# Patient Record
Sex: Female | Born: 1935 | Race: White | Hispanic: No | Smoking: Former smoker
Health system: Southern US, Community
[De-identification: ages and names within clinical notes are randomized; demographics above are authoritative.]

## PROBLEM LIST (undated history)

## (undated) DIAGNOSIS — I714 Abdominal aortic aneurysm, without rupture: Secondary | ICD-10-CM

## (undated) DIAGNOSIS — E039 Hypothyroidism, unspecified: Secondary | ICD-10-CM

## (undated) DIAGNOSIS — I350 Nonrheumatic aortic (valve) stenosis: Secondary | ICD-10-CM

## (undated) DIAGNOSIS — I10 Essential (primary) hypertension: Secondary | ICD-10-CM

## (undated) HISTORY — PX: TONSILLECTOMY: SUR1361

---

## 2005-07-18 ENCOUNTER — Ambulatory Visit: Payer: Self-pay | Admitting: Ophthalmology

## 2005-09-14 ENCOUNTER — Emergency Department: Payer: Self-pay | Admitting: Emergency Medicine

## 2006-05-30 ENCOUNTER — Ambulatory Visit: Payer: Self-pay | Admitting: Internal Medicine

## 2007-06-01 ENCOUNTER — Ambulatory Visit: Payer: Self-pay | Admitting: Internal Medicine

## 2009-02-19 ENCOUNTER — Ambulatory Visit: Payer: Self-pay | Admitting: Internal Medicine

## 2009-07-08 ENCOUNTER — Ambulatory Visit: Payer: Self-pay | Admitting: Ophthalmology

## 2009-07-20 ENCOUNTER — Ambulatory Visit: Payer: Self-pay | Admitting: Ophthalmology

## 2010-02-24 ENCOUNTER — Ambulatory Visit: Payer: Self-pay | Admitting: Internal Medicine

## 2010-05-25 ENCOUNTER — Emergency Department (HOSPITAL_COMMUNITY): Admission: EM | Admit: 2010-05-25 | Discharge: 2010-05-25 | Payer: Self-pay | Admitting: Emergency Medicine

## 2011-03-02 ENCOUNTER — Ambulatory Visit: Payer: Self-pay | Admitting: Internal Medicine

## 2012-03-12 ENCOUNTER — Ambulatory Visit: Payer: Self-pay | Admitting: Internal Medicine

## 2012-03-14 ENCOUNTER — Ambulatory Visit: Payer: Self-pay | Admitting: Internal Medicine

## 2012-06-18 ENCOUNTER — Ambulatory Visit: Payer: Self-pay | Admitting: Internal Medicine

## 2013-03-18 ENCOUNTER — Ambulatory Visit: Payer: Self-pay | Admitting: Internal Medicine

## 2013-11-27 ENCOUNTER — Ambulatory Visit: Payer: Self-pay | Admitting: Internal Medicine

## 2014-03-19 ENCOUNTER — Ambulatory Visit: Payer: Self-pay | Admitting: Internal Medicine

## 2014-06-14 ENCOUNTER — Emergency Department: Payer: Self-pay | Admitting: Emergency Medicine

## 2015-01-22 ENCOUNTER — Emergency Department: Admit: 2015-01-22 | Disposition: A | Discharge status: Home / Self Care | Payer: Self-pay | Admitting: Internal Medicine

## 2015-01-22 ENCOUNTER — Ambulatory Visit: Admit: 2015-01-22 | Disposition: A | Discharge status: Home / Self Care | Payer: Self-pay | Admitting: Internal Medicine

## 2016-04-13 ENCOUNTER — Other Ambulatory Visit: Payer: Self-pay | Admitting: Internal Medicine

## 2016-04-13 DIAGNOSIS — Z1231 Encounter for screening mammogram for malignant neoplasm of breast: Secondary | ICD-10-CM

## 2016-05-02 ENCOUNTER — Ambulatory Visit
Admission: RE | Admit: 2016-05-02 | Discharge: 2016-05-02 | Disposition: A | Discharge status: Home / Self Care | Payer: Medicare HMO | Source: Ambulatory Visit | Attending: Internal Medicine | Admitting: Internal Medicine

## 2016-05-02 ENCOUNTER — Other Ambulatory Visit: Payer: Self-pay | Admitting: Internal Medicine

## 2016-05-02 DIAGNOSIS — Z1231 Encounter for screening mammogram for malignant neoplasm of breast: Secondary | ICD-10-CM | POA: Insufficient documentation

## 2016-08-02 ENCOUNTER — Emergency Department: Payer: Medicare HMO

## 2016-08-02 ENCOUNTER — Encounter: Payer: Self-pay | Admitting: Emergency Medicine

## 2016-08-02 ENCOUNTER — Emergency Department
Admission: EM | Admit: 2016-08-02 | Discharge: 2016-08-02 | Disposition: A | Discharge status: Home / Self Care | Payer: Medicare HMO | Attending: Emergency Medicine | Admitting: Emergency Medicine

## 2016-08-02 DIAGNOSIS — N3000 Acute cystitis without hematuria: Secondary | ICD-10-CM | POA: Insufficient documentation

## 2016-08-02 DIAGNOSIS — Y999 Unspecified external cause status: Secondary | ICD-10-CM | POA: Insufficient documentation

## 2016-08-02 DIAGNOSIS — Z87891 Personal history of nicotine dependence: Secondary | ICD-10-CM | POA: Diagnosis not present

## 2016-08-02 DIAGNOSIS — M5137 Other intervertebral disc degeneration, lumbosacral region: Secondary | ICD-10-CM | POA: Insufficient documentation

## 2016-08-02 DIAGNOSIS — Y9389 Activity, other specified: Secondary | ICD-10-CM | POA: Diagnosis not present

## 2016-08-02 DIAGNOSIS — W1800XA Striking against unspecified object with subsequent fall, initial encounter: Secondary | ICD-10-CM | POA: Insufficient documentation

## 2016-08-02 DIAGNOSIS — M25552 Pain in left hip: Secondary | ICD-10-CM | POA: Diagnosis present

## 2016-08-02 DIAGNOSIS — Y929 Unspecified place or not applicable: Secondary | ICD-10-CM | POA: Diagnosis not present

## 2016-08-02 DIAGNOSIS — W19XXXA Unspecified fall, initial encounter: Secondary | ICD-10-CM

## 2016-08-02 DIAGNOSIS — Y92009 Unspecified place in unspecified non-institutional (private) residence as the place of occurrence of the external cause: Secondary | ICD-10-CM

## 2016-08-02 LAB — URINALYSIS COMPLETE WITH MICROSCOPIC (ARMC ONLY)
Bacteria, UA: NONE SEEN
Bilirubin Urine: NEGATIVE
Glucose, UA: NEGATIVE mg/dL
Hgb urine dipstick: NEGATIVE
Ketones, ur: NEGATIVE mg/dL
Nitrite: NEGATIVE
Protein, ur: NEGATIVE mg/dL
Specific Gravity, Urine: 1.016 (ref 1.005–1.030)
pH: 5 (ref 5.0–8.0)

## 2016-08-02 MED ORDER — TRAMADOL HCL 50 MG PO TABS: 50.0000 mg | ORAL_TABLET | Freq: Two times a day (BID) | ORAL | 0 refills | Status: DC

## 2016-08-02 MED ORDER — TRAMADOL HCL 50 MG PO TABS
50.0000 mg | ORAL_TABLET | Freq: Once | ORAL | Status: AC
Start: 2016-08-02 — End: 2016-08-02
  Administered 2016-08-02: 50 mg via ORAL
  Filled 2016-08-02: qty 1

## 2016-08-02 MED ORDER — CEPHALEXIN 500 MG PO CAPS: 500.0000 mg | ORAL_CAPSULE | Freq: Two times a day (BID) | ORAL | 0 refills | Status: AC

## 2016-08-02 MED ORDER — CEPHALEXIN 500 MG PO CAPS
500.0000 mg | ORAL_CAPSULE | Freq: Once | ORAL | Status: AC
  Administered 2016-08-02: 500 mg via ORAL
  Filled 2016-08-02: qty 1

## 2016-08-02 NOTE — Discharge Instructions (Signed)
Your exam and x-ray do not show a hip or pelvic fracture. You have some chronic tendinitis in the soft tissues/tendons around the left hip. You also have some significant degenerative disc disease and arthritis in the lower back. This could be the cause of your pain and disability over the last few weeks. Take the pain medicine as needed. Be careful about driving after taking this medicine. You are also being treated for a bladder infection. Take the antibiotic as directed. Drink plenty of water, and empty your bladder regularly. Follow-up with Dr. Arlana Pouchate for recheck in 1 week or so.

## 2016-08-02 NOTE — ED Provider Notes (Signed)
[facility] Emergency Department Provider Note ____________________________________________  Time seen: 1213  I have reviewed the triage vital signs and the nursing notes.  HISTORY  Chief Complaint  Fall and Hip Pain  HPI Debra Bartlett is a 82 y.o. female who lives alone, presents to the ED accompanied by her church friends. She was brought into the patient's continued complaint of disability and painto her left hip. Patient reports a remote fall about 4 weeks prior, as she was getting out of her car. She describes falling, hitting the left buttocks and leg. She also describes bumping her head but denies any loss of consciousness. The patient has not sought care for interim complaints since the initial fall. She denies any recent injury, or reinjury in the interim. She has been reportedly debilitated by the acute and ongoing pain to her left hip and buttocks. The patient reports increased pain with transitioning from sit to stand. Her friends comment she seems slightly more confused than usual today.   No past medical history on file.  There are no active problems to display for this patient.  No past surgical history on file.  Prior to Admission medications   Medication Sig Start Date End Date Taking? Authorizing Provider  cephALEXin (KEFLEX) 500 MG capsule Take 1 capsule (500 mg total) by mouth 2 (two) times daily. 08/02/16 08/09/16  Trevia Nop V Bacon Jamera Vanloan, PA-C  traMADol (ULTRAM) 50 MG tablet Take 1 tablet (50 mg total) by mouth 2 (two) times daily. 08/02/16   Shawnte Demarest V Bacon Keshaun Dubey, PA-C   Allergies Review of patient's allergies indicates no known allergies.  No family history on file.  Social History Social History  Substance Use Topics  . Smoking status: Former Games developermoker  . Smokeless tobacco: Former NeurosurgeonUser  . Alcohol use Not on file   Review of Systems  Constitutional: Negative for fever. Cardiovascular: Negative for chest pain. Respiratory: Negative for  shortness of breath. Gastrointestinal: Negative for abdominal pain, vomiting and diarrhea. Genitourinary: Negative for dysuria. Musculoskeletal: Positive for back and left hip pain. Skin: Negative for rash. Neurological: Negative for headaches, focal weakness or numbness. ____________________________________________  PHYSICAL EXAM:  VITAL SIGNS: ED Triage Vitals  Enc Vitals Group     BP 08/02/16 1156 (!) 179/58     Pulse Rate 08/02/16 1156 (!) 58     Resp 08/02/16 1156 18     Temp 08/02/16 1156 98.5 F (36.9 C)     Temp Source 08/02/16 1156 Oral     SpO2 08/02/16 1156 99 %     Weight 08/02/16 1154 145 lb (65.8 kg)     Height 08/02/16 1154 5\' 3"  (1.6 m)     Head Circumference --      Peak Flow --      Pain Score --      Pain Loc --      Pain Edu? --      Excl. in GC? --    Constitutional: Alert and oriented. Well appearing and in no distress. Head: Normocephalic and atraumatic. Eyes: Conjunctivae are normal. PERRL. Normal extraocular movements Ears: Canals clear. TMs intact bilaterally. Nose: No congestion/rhinorrhea/epistaxis. Mouth/Throat: Mucous membranes are moist. Cardiovascular: Normal rate, regular rhythm. Normal distal pulses. Respiratory: Normal respiratory effort. No wheezes/rales/rhonchi. Gastrointestinal: Soft and nontender. No distention. Musculoskeletal: Normal spinal alignment without midline tenderness, spasm, deformity, or step-off. Minimally tender to palp over the left LS junction. Normal hip ROM. No hip pointer tenderness, no groin pain. Negative supine SLR. Nontender with normal  range of motion in all extremities.  Neurologic:  Normal gait without ataxia. Normal speech and language. No gross focal neurologic deficits are appreciated. Skin:  Skin is warm, dry and intact. No rash noted. No bruise, ecchymosis, or abrasion.  ____________________________________________   LABS (pertinent positives/negatives) Labs Reviewed  URINALYSIS COMPLETEWITH  MICROSCOPIC ([facility] ONLY) - Abnormal; Notable for the following:       Result Value   Color, Urine YELLOW (*)    APPearance CLEAR (*)    Leukocytes, UA 2+ (*)    Squamous Epithelial / LPF 0-5 (*)    All other components within normal limits  URINE CULTURE  ____________________________________________   RADIOLOGY  Right Hip IMPRESSION: Negative for fracture. 8 x 15 mm calcification superior to the femoral neck in the soft tissues likely due to old soft tissue injury.  I, Jahmya Onofrio, Charlesetta IvoryJenise V Bacon, personally viewed and evaluated these images (plain radiographs) as part of my medical decision making, as well as reviewing the written report by the radiologist. ____________________________________________  PROCEDURES  Macrobid 100 mg PO Ultram 50 mg PO ____________________________________________  INITIAL IMPRESSION / ASSESSMENT AND PLAN / ED COURSE  Patient with negative hip & pelvic films. She does have significant DDD in the visible pelvic films. This may represent the patients pain source. She also has a UTI. She will be treated with Keflex and Ultram. She should follow-up with Dr. Arlana Pouchate for recheck.   Clinical Course   ____________________________________________  FINAL CLINICAL IMPRESSION(S) / ED DIAGNOSES  Final diagnoses:  Left hip pain  Fall in home, initial encounter  Acute cystitis without hematuria  DDD (degenerative disc disease), lumbosacral      Lissa HoardJenise V Bacon Lenox Bink, PA-C 08/02/16 1634    Nita Sicklearolina Veronese, MD 08/03/16 1332

## 2016-08-02 NOTE — ED Triage Notes (Signed)
Pt here for a friend from church.  She brought the patient in due to pain in left hip. Pt does report fall but, according to patient, the fall was about 1 month ago.  Patient has pain in left hip only with ambulation.  Pt has confusion but friend, who sees her about once a week, feels that the confusion is a little worse.

## 2016-08-04 LAB — URINE CULTURE: Special Requests: NORMAL

## 2016-09-28 ENCOUNTER — Other Ambulatory Visit: Payer: Self-pay | Admitting: Internal Medicine

## 2016-09-28 ENCOUNTER — Ambulatory Visit
Admission: RE | Admit: 2016-09-28 | Discharge: 2016-09-28 | Disposition: A | Discharge status: Home / Self Care | Payer: Medicare HMO | Source: Ambulatory Visit | Attending: Internal Medicine | Admitting: Internal Medicine

## 2016-09-28 DIAGNOSIS — S0003XA Contusion of scalp, initial encounter: Secondary | ICD-10-CM | POA: Diagnosis not present

## 2016-09-28 DIAGNOSIS — S0990XA Unspecified injury of head, initial encounter: Secondary | ICD-10-CM | POA: Diagnosis present

## 2016-09-28 DIAGNOSIS — X58XXXA Exposure to other specified factors, initial encounter: Secondary | ICD-10-CM | POA: Diagnosis not present

## 2016-09-28 DIAGNOSIS — G319 Degenerative disease of nervous system, unspecified: Secondary | ICD-10-CM | POA: Diagnosis not present

## 2017-08-28 ENCOUNTER — Emergency Department
Admission: EM | Admit: 2017-08-28 | Discharge: 2017-08-28 | Disposition: A | Discharge status: Home / Self Care | Payer: Medicare HMO | Attending: Emergency Medicine | Admitting: Emergency Medicine

## 2017-08-28 ENCOUNTER — Encounter: Payer: Self-pay | Admitting: *Deleted

## 2017-08-28 ENCOUNTER — Emergency Department: Payer: Medicare HMO

## 2017-08-28 ENCOUNTER — Other Ambulatory Visit: Payer: Self-pay

## 2017-08-28 DIAGNOSIS — I714 Abdominal aortic aneurysm, without rupture, unspecified: Secondary | ICD-10-CM

## 2017-08-28 DIAGNOSIS — M25552 Pain in left hip: Secondary | ICD-10-CM

## 2017-08-28 DIAGNOSIS — I10 Essential (primary) hypertension: Secondary | ICD-10-CM | POA: Diagnosis not present

## 2017-08-28 DIAGNOSIS — R296 Repeated falls: Secondary | ICD-10-CM | POA: Insufficient documentation

## 2017-08-28 DIAGNOSIS — Z87891 Personal history of nicotine dependence: Secondary | ICD-10-CM | POA: Diagnosis not present

## 2017-08-28 HISTORY — DX: Essential (primary) hypertension: I10

## 2017-08-28 LAB — BASIC METABOLIC PANEL
ANION GAP: 8 (ref 5–15)
BUN: 17 mg/dL (ref 6–20)
CO2: 28 mmol/L (ref 22–32)
Calcium: 9.4 mg/dL (ref 8.9–10.3)
Chloride: 102 mmol/L (ref 101–111)
Creatinine, Ser: 0.64 mg/dL (ref 0.44–1.00)
GLUCOSE: 75 mg/dL (ref 65–99)
POTASSIUM: 4.2 mmol/L (ref 3.5–5.1)
Sodium: 138 mmol/L (ref 135–145)

## 2017-08-28 LAB — CBC
HEMATOCRIT: 37.4 % (ref 35.0–47.0)
HEMOGLOBIN: 12.7 g/dL (ref 12.0–16.0)
MCH: 31.4 pg (ref 26.0–34.0)
MCHC: 34 g/dL (ref 32.0–36.0)
MCV: 92.3 fL (ref 80.0–100.0)
Platelets: 204 10*3/uL (ref 150–440)
RBC: 4.05 MIL/uL (ref 3.80–5.20)
RDW: 14.6 % — ABNORMAL HIGH (ref 11.5–14.5)
WBC: 8.6 10*3/uL (ref 3.6–11.0)

## 2017-08-28 MED ORDER — LISINOPRIL 10 MG PO TABS
10.0000 mg | ORAL_TABLET | Freq: Once | ORAL | Status: AC
  Administered 2017-08-28: 10 mg via ORAL
  Filled 2017-08-28: qty 1

## 2017-08-28 MED ORDER — IBUPROFEN 600 MG PO TABS
600.0000 mg | ORAL_TABLET | Freq: Once | ORAL | Status: AC
  Administered 2017-08-28: 600 mg via ORAL
  Filled 2017-08-28: qty 1

## 2017-08-28 NOTE — ED Triage Notes (Signed)
Pt presents w/ c/o frequent falls, x 4 in past 2 weeks. Pt states it started w/ her falling while carrying groceries. Pt states first fall occurred x 3 months ago. Pt states last fall was a week ago down four steps. Pt denies LOC on last occasion of falling and denies any LOC for any prior fall. Pt states he has hit her head. Pt was unable to related whether or not she takes blood thinners. Pt c/o R hip pain secondary to fall. Pt states she has been taking OTC meds (tylenol) for her pain w/ relief. Pt is hypertensive during triage. Pt states she has not sought medical care for her falls prior to this because she "had no one to encourage her" like the man with her today.

## 2017-08-28 NOTE — ED Notes (Signed)
Patient transported to X-ray 

## 2017-08-28 NOTE — Discharge Instructions (Signed)
Today you were not found to have any broken bones in your hips.  You may take Tylenol for your pain.  You may also use an ice pack or heating pad for 15 minutes every 2 hours to help with pain or discomfort.  Please make an appointment to be rechecked with your primary care doctor.  Please also let your primary care doctor know that an abdominal aortic aneurysm was found on your CT scan and will need more pictures, and likely consultation by a vascular surgeon in the near future.  Return to the emergency department if you develop severe pain, lightheadedness or fainting, fever, or any other symptoms concerning to you.

## 2017-08-28 NOTE — ED Notes (Signed)
Pt presents today post fall 1 mth ago. Pt complains of pain in the hip that worsens when moving. Pt states she fell when she stumped her toe and fell down stairs and presents with bruise to right eye. Pt has been ambulatory with out any complaints. Family at bedside awaiting EDP.

## 2017-08-28 NOTE — ED Provider Notes (Signed)
[facility] Emergency Department Provider Note  ____________________________________________  Time seen: Approximately 5:30 PM  I have reviewed the triage vital signs and the nursing notes.   HISTORY  Chief Complaint Fall; Hypertension; and Hip Pain    HPI Debra Bartlett is a 83 y.o. female who lives independently with a history of recurrent falls presenting with left hip pain.  The patient reports that occasionally she becomes unsteady and falls down.  A proximally 1 month ago she had a fall which resulted in a black eye, which has almost completely resolved.  She has never fallen on her left hip, but for about a month she has been having pain in the left hip which is worse with standing and walking.  She denies any numbness or tingling, headache, lightheadedness or syncope.  She does not experience chest pain, palpitations, or shortness of breath.  She states that she uses Tylenol for her pain, which alleviates her symptoms completely.  She would not have come to the hospital today except that her friend "made her."  Past Medical History:  Diagnosis Date  . Hypertension     There are no active problems to display for this patient.   History reviewed. No pertinent surgical history.  Current Outpatient Rx  . Order #: [id number] Class: Historical Med  . Order #: [id number] Class: Historical Med  . Order #: [id number] Class: Historical Med    Allergies Patient has no known allergies.  History reviewed. No pertinent family history.  Social History Social History   Tobacco Use  . Smoking status: Former Smoker    Types: Cigarettes  . Smokeless tobacco: Former Engineer, water Use Topics  . Alcohol use: No    Frequency: Never  . Drug use: No    Review of Systems Constitutional: No fever/chills.  No lightheadedness or syncope.  Positive recurrent falls. Eyes: No visual changes.  No blurred or double vision.  Positive right orbital ecchymosis, now almost  completely resolved. ENT: No sore throat. No congestion or rhinorrhea.  No ear pain. Cardiovascular: Denies chest pain. Denies palpitations. Respiratory: Denies shortness of breath.  No cough. Gastrointestinal: No abdominal pain.  No nausea, no vomiting.  No diarrhea.  No constipation. Genitourinary: Negative for dysuria. Musculoskeletal: Negative for back pain.  No neck pain.  Positive left hip pain.  Skin: Negative for rash. Neurological: Negative for headaches. No focal numbness, tingling or weakness.  Positive occasional unsteady gait resulting in falls.    ____________________________________________   PHYSICAL EXAM:  VITAL SIGNS: ED Triage Vitals [08/28/17 1524]  Enc Vitals Group     BP (!) 210/65     Pulse Rate 64     Resp 20     Temp 98 F (36.7 C)     Temp Source Oral     SpO2 100 %     Weight 145 lb (65.8 kg)     Height 5\' 6"  (1.676 m)     Head Circumference      Peak Flow      Pain Score      Pain Loc      Pain Edu?      Excl. in GC?     Constitutional: Alert and oriented.  Chronically ill appearing and in no acute distress. Answers questions appropriately. Eyes: Conjunctivae are normal.  EOMI. No scleral icterus. Head: Positive resolving ecchymosis on the right zygomatic arch.  No battle sign. Nose: No congestion/rhinnorhea.  Swelling over the nose or septal hematoma. Mouth/Throat: Mucous membranes are  moist.  Malocclusion. Neck: No stridor.  Supple.  No JVD.  No meningismus.  No midline C-spine tenderness to palpation, step-offs or deformities.  Cardiovascular: Normal rate, regular rhythm. No murmurs, rubs or gallops.  Respiratory: Normal respiratory effort.  No accessory muscle use or retractions. Lungs CTAB.  No wheezes, rales or ronchi. Gastrointestinal: Soft, nontender and nondistended.  No guarding or rebound.  No peritoneal signs. Musculoskeletal: Pelvis stable.  Range of motion of the bilateral ankles, knees, and hips without pain.  There is no  tenderness to palpation over the left greater trochanter.  However, the patient does have some discomfort when she goes from sitting to standing.  She has a normal gait without significant discomfort, limp or ataxia.  No LE edema. No ttp in the calves or palpable cords.  Negative Homan's sign. Neurologic:  A&Ox3.  Speech is clear.  Face and smile are symmetric.  EOMI. 5 out of 5 quadriceps and hamstring strength, hip flexors, dorsiflexion and plantar flexion.  Normal sensation to light touch BLE. Skin:  Skin is warm, dry and intact. No rash noted. Psychiatric: Mood and affect are normal. Speech and behavior are normal.  Normal judgement.  ____________________________________________   LABS (all labs ordered are listed, but only abnormal results are displayed)  Labs Reviewed  CBC - Abnormal; Notable for the following components:      Result Value   RDW 14.6 (*)    All other components within normal limits  BASIC METABOLIC PANEL   ____________________________________________  EKG  ED ECG REPORT I, Rockne MenghiniNorman, Anne-Caroline, the attending physician, personally viewed and interpreted this ECG.   Date: 08/28/2017  EKG Time: 1516  Rate: 58  Rhythm: sinus bradycardia  Axis: leftward  Intervals:none  ST&T Change: No STEMI  This EKG is compared to the only other EKG we have from 2010, which is grossly unchanged in morphology.  ____________________________________________  RADIOLOGY  Ct Pelvis Wo Contrast  Result Date: 08/28/2017 CLINICAL DATA:  Frequent fall with bilateral hip pain EXAM: CT PELVIS WITHOUT CONTRAST TECHNIQUE: Multidetector CT imaging of the pelvis was performed following the standard protocol without intravenous contrast. COMPARISON:  08/28/2017, 11/27/2013 FINDINGS: Urinary Tract:  Urinary bladder is unremarkable. Bowel:  Unremarkable visualized pelvic bowel loops. Vascular/Lymphatic: Incompletely visualized large infrarenal abdominal aortic aneurysm measuring at least  6.8 x 6.5 cm. Possible small amount of high density thrombus along the left aspect of the aneurysm. No significantly enlarged lymph nodes. Reproductive:  No mass or other significant abnormality Other:  No significant pelvic effusion.  Tiny fat in the umbilicus Musculoskeletal: No acute displaced fracture or malalignment. Pubic symphysis and rami are intact. Mild degenerative changes of the bilateral hips. Gas within both SI joints. Degenerative changes of the lumbosacral spine with prominent facet degenerative change. IMPRESSION: 1. No definite acute fracture or malalignment by CT. Degenerative changes of the hips and spine. 2. Incompletely visualized large infrarenal abdominal aortic aneurysm measuring at least 6.8 cm. Dedicated CTA would be recommended to more completely evaluate the aneurysm. Electronically Signed   By: Jasmine PangKim  Fujinaga M.D.   On: 08/28/2017 18:56   Dg Hip Unilat With Pelvis 2-3 Views Left  Result Date: 08/28/2017 CLINICAL DATA:  Right hip pain after multiple falls. EXAM: DG HIP (WITH OR WITHOUT PELVIS) 2-3V LEFT COMPARISON:  Radiographs of August 02, 2016. FINDINGS: There is no evidence of hip fracture or dislocation. No significant joint space narrowing is noted. Stable crescent-shaped bone fragment seen adjacent to superior acetabulum consistent with old injury. Possible avascular  necrosis of right femoral head. IMPRESSION: No significant abnormality seen in the left hip. 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 Electronically Signed   By: Lupita RaiderJames  Green Jr, M.D.   On: 08/28/2017 17:37    ____________________________________________   PROCEDURES  Procedure(s) performed: None  Procedures  Critical Care performed: No ____________________________________________   INITIAL IMPRESSION / ASSESSMENT AND PLAN / ED COURSE  Pertinent labs & imaging results that were available during my care of the patient were reviewed by me and considered in my medical decision making (see  chart for details).  83 y.o. female presenting for recurrent falls with left hip pain.  Overall, the patient has a reassuring examination.  She does not give any history that would be suggestive of syncope, and her EKG is grossly stable from 2010 with some sinus bradycardia; I will have her follow-up with her primary care physician about this.  The patient's laboratory studies are reassuring with no evidence of anemia or electrolyte dysfunction.  Her hip x-ray shows possible AVN of the right femoral head, but her pain is in the left.  I will get a CT of the pelvis to evaluate both her hips.  She will be treated symptomatically and reevaluated for final disposition.  ----------------------------------------- 7:47 PM on 08/28/2017 -----------------------------------------  The patient's workup in the emergency department has been reassuring.  She has been ambulatory throughout her stay with some mild discomfort.  Her CT scan does not show any bony abnormalities of the hips bilaterally.  She does have an incompletely viewed infrarenal AAA measuring 6.8 cm, which I have told the patient about.  She will follow-up with her primary care physician for dedicated imaging and likely vascular surgical consultation.  She understands the risk of this condition and the importance of follow-up.  At this time, the patient will be discharged home with suggestions for symptomatic treatment of her hip pain, and instructions for close PMD follow-up.  Return precautions were discussed.  I reviewed the patient's chart.  ____________________________________________  FINAL CLINICAL IMPRESSION(S) / ED DIAGNOSES  Final diagnoses:  Left hip pain  Recurrent falls  Abdominal aortic aneurysm (AAA) without rupture (HCC)         NEW MEDICATIONS STARTED DURING THIS VISIT:  This SmartLink is deprecated. Use AVSMEDLIST instead to display the medication list for a patient.    Rockne MenghiniNorman, Anne-Caroline, MD 08/28/17 [phone]

## 2017-08-28 NOTE — ED Notes (Signed)
First nurse note   [facility]  Having left hip pain  Has had several fall over the past several months  Last fall was 2 weeks ago now having increased pain

## 2017-09-19 ENCOUNTER — Encounter (INDEPENDENT_AMBULATORY_CARE_PROVIDER_SITE_OTHER): Payer: Self-pay | Admitting: Vascular Surgery

## 2017-09-19 ENCOUNTER — Ambulatory Visit (INDEPENDENT_AMBULATORY_CARE_PROVIDER_SITE_OTHER): Payer: Medicare HMO | Admitting: Vascular Surgery

## 2017-09-19 VITALS — BP 134/66 | HR 58 | Resp 16 | Ht 66.0 in | Wt 141.0 lb

## 2017-09-19 DIAGNOSIS — I714 Abdominal aortic aneurysm, without rupture, unspecified: Secondary | ICD-10-CM

## 2017-09-19 DIAGNOSIS — I10 Essential (primary) hypertension: Secondary | ICD-10-CM | POA: Insufficient documentation

## 2017-09-19 DIAGNOSIS — I713 Abdominal aortic aneurysm, ruptured, unspecified: Secondary | ICD-10-CM

## 2017-09-19 NOTE — Progress Notes (Signed)
Patient ID: Debra Bartlett, female   DOB: 07-07-1925, 83 y.o.   MRN: [id number]  Chief Complaint  Patient presents with  . New Patient (Initial Visit)    ref Hall Busing for AAA    HPI [name] is a 83 y.o. female.  I am asked to see the patient by Dr. Hall Busing for evaluation of AAA.  The patient reports hip pain and had a CT scan of the pelvis about 3-4 weeks ago.  This shows an incidental finding of a 6.8 cm AAA.  The upper extent of this aneurysm is not seen on the pelvis only CT scan (why that was done I do not know as in general any pelvis scan should include an abdominal portion).  This was also a noncontrast CT scan so the information provided by this is somewhat independently reviewed the CT scan.  She does have some chronic back and pelvis pain although this is not any more severe now than it has been for weeks or months.  She denies any peripheral embolization signs.  No fever or chills.  No trauma or injury.   Past Medical History:  Diagnosis Date  . Hypertension     Past Surgical History:  Procedure Laterality Date  . NO PAST SURGERIES     Family History No bleeding disorders, clotting disorders, autoimmune diseases, or aneurysms  Social History Social History   Tobacco Use  . Smoking status: Former Smoker    Types: Cigarettes  . Smokeless tobacco: Former Network engineer Use Topics  . Alcohol use: No    Frequency: Never  . Drug use: No     No Known Allergies  Current Outpatient Medications  Medication Sig Dispense Refill  . acetaminophen (TYLENOL) 500 MG tablet Take 500-1,000 mg by mouth every 6 (six) hours as needed.    Marland Kitchen levothyroxine (SYNTHROID, LEVOTHROID) 100 MCG tablet Take 1 tablet by mouth daily.    Marland Kitchen lisinopril (PRINIVIL,ZESTRIL) 5 MG tablet Take 1 tablet by mouth daily.     No current facility-administered medications for this visit.       REVIEW OF SYSTEMS (Negative unless checked)  Constitutional: [] Weight loss  [] Fever  [] Chills Cardiac: [] Chest  pain   [] Chest pressure   [] Palpitations   [] Shortness of breath when laying flat   [] Shortness of breath at rest   [] Shortness of breath with exertion. Vascular:  [] Pain in legs with walking   [] Pain in legs at rest   [] Pain in legs when laying flat   [] Claudication   [] Pain in feet when walking  [] Pain in feet at rest  [] Pain in feet when laying flat   [] History of DVT   [] Phlebitis   [] Swelling in legs   [] Varicose veins   [] Non-healing ulcers Pulmonary:   [] Uses home oxygen   [] Productive cough   [] Hemoptysis   [] Wheeze  [] COPD   [] Asthma Neurologic:  [] Dizziness  [] Blackouts   [] Seizures   [] History of stroke   [] History of TIA  [] Aphasia   [] Temporary blindness   [] Dysphagia   [] Weakness or numbness in arms   [] Weakness or numbness in legs Musculoskeletal:  [x] Arthritis   [] Joint swelling   [] Joint pain   [x] Low back pain Hematologic:  [] Easy bruising  [] Easy bleeding   [] Hypercoagulable state   [] Anemic  [] Hepatitis Gastrointestinal:  [] Blood in stool   [] Vomiting blood  [] Gastroesophageal reflux/heartburn   [] Abdominal pain Genitourinary:  [] Chronic kidney disease   [] Difficult urination  [] Frequent urination  [] Burning with  urination   [] Hematuria Skin:  [] Rashes   [] Ulcers   [] Wounds Psychological:  [] History of anxiety   []  History of major depression.    Physical Exam BP 134/66 (BP Location: Left Arm)   Pulse (!) 58   Resp 16   Ht 5' 6"  (1.676 m)   Wt 64 kg (141 lb)   BMI 22.76 kg/m  Gen:  WD/WN, NAD.  Appears younger than stated age Head: [place]/AT, No temporalis wasting.  Ear/Nose/Throat: Hearing grossly intact, nares w/o erythema or drainage, oropharynx w/o Erythema/Exudate Eyes: Conjunctiva clear, sclera non-icteric  Neck: trachea midline.  No JVD.  Pulmonary:  Good air movement, respirations not labored, no use of accessory muscles Cardiac: RRR, normal S1, S2 Vascular:  Vessel Right Left  Radial Palpable Palpable                                   Gastrointestinal:  soft, non-tender/non-distended.  Enlarged aortic impulse Musculoskeletal: M/S 5/5 throughout.  Extremities without ischemic changes.  No deformity or atrophy.  Mild lower extremity edema. Neurologic: Sensation grossly intact in extremities.  Symmetrical.  Speech is fluent. Motor exam as listed above. Psychiatric: Judgment and insight appear fair. Dermatologic: No rashes or ulcers noted.  No cellulitis or open wounds. Lymph : No Cervical, Axillary, or Inguinal lymphadenopathy.   Radiology Ct Pelvis Wo Contrast  Result Date: 08/28/2017 CLINICAL DATA:  Frequent fall with bilateral hip pain EXAM: CT PELVIS WITHOUT CONTRAST TECHNIQUE: Multidetector CT imaging of the pelvis was performed following the standard protocol without intravenous contrast. COMPARISON:  08/28/2017, 11/27/2013 FINDINGS: Urinary Tract:  Urinary bladder is unremarkable. Bowel:  Unremarkable visualized pelvic bowel loops. Vascular/Lymphatic: Incompletely visualized large infrarenal abdominal aortic aneurysm measuring at least 6.8 x 6.5 cm. Possible small amount of high density thrombus along the left aspect of the aneurysm. No significantly enlarged lymph nodes. Reproductive:  No mass or other significant abnormality Other:  No significant pelvic effusion.  Tiny fat in the umbilicus Musculoskeletal: No acute displaced fracture or malalignment. Pubic symphysis and rami are intact. Mild degenerative changes of the bilateral hips. Gas within both SI joints. Degenerative changes of the lumbosacral spine with prominent facet degenerative change. IMPRESSION: 1. No definite acute fracture or malalignment by CT. Degenerative changes of the hips and spine. 2. Incompletely visualized large infrarenal abdominal aortic aneurysm measuring at least 6.8 cm. Dedicated CTA would be recommended to more completely evaluate the aneurysm. Electronically Signed   By: Donavan Foil M.D.   On: 08/28/2017 18:56   Dg Hip Unilat With Pelvis 2-3 Views  Left  Result Date: 08/28/2017 CLINICAL DATA:  Right hip pain after multiple falls. EXAM: DG HIP (WITH OR WITHOUT PELVIS) 2-3V LEFT COMPARISON:  Radiographs of August 02, 2016. FINDINGS: There is no evidence of hip fracture or dislocation. No significant joint space narrowing is noted. Stable crescent-shaped bone fragment seen adjacent to superior acetabulum consistent with old injury. 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 IMPRESSION: No significant abnormality seen in the left hip. 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 Electronically Signed   By: Marijo Conception, M.D.   On: 08/28/2017 17:37    Labs Recent Results (from the past 2160 hour(s))  Basic metabolic panel     Status: None   Collection Time: 08/28/17  3:32 PM  Result Value Ref Range   Sodium 138 135 - 145 mmol/L   Potassium 4.2 3.5 - 5.1 mmol/L  Chloride 102 101 - 111 mmol/L   CO2 28 22 - 32 mmol/L   Glucose, Bld 75 65 - 99 mg/dL   BUN 17 6 - 20 mg/dL   Creatinine, Ser 0.64 0.44 - 1.00 mg/dL   Calcium 9.4 8.9 - 10.3 mg/dL   GFR calc non Af Amer >60 >60 mL/min   GFR calc Af Amer >60 >60 mL/min    Comment: (NOTE) The eGFR has been calculated using the CKD EPI equation. This calculation has not been validated in all clinical situations. eGFR's persistently <60 mL/min signify possible Chronic Kidney Disease.    Anion gap 8 5 - 15  CBC     Status: Abnormal   Collection Time: 08/28/17  3:32 PM  Result Value Ref Range   WBC 8.6 3.6 - 11.0 K/uL   RBC 4.05 3.80 - 5.20 MIL/uL   Hemoglobin 12.7 12.0 - 16.0 g/dL   HCT 37.4 35.0 - 47.0 %   MCV 92.3 80.0 - 100.0 fL   MCH 31.4 26.0 - 34.0 pg   MCHC 34.0 32.0 - 36.0 g/dL   RDW 14.6 (H) 11.5 - 14.5 %   Platelets 204 150 - 440 K/uL    Assessment/Plan:  Hypertension blood pressure control important in reducing the progression of atherosclerotic disease. On appropriate oral medications.   AAA (abdominal aortic aneurysm) without rupture (HCC) Recommend:  The patient has large abdominal aortic aneurysm that is incompletely visualized on a limited noncontrasted CT scan of the pelvis only.  The upper extent of the aneurysm has not been evaluated.  The patient is otherwise in reasonable health.   Therefore, the patient should undergo endovascular repair of the AAA to prevent future leathal rupture.   Patient will require CT angiography of the abdomen and pelvis in order to appropriately plan repair of the AAA.  The risks and benefits as well as the alternative therapies was discussed in detail with the patient. All questions were answered. The patient agrees to move forward the AAA repair and therefore with CT scan.  The patient will follow up with me in the office after the CT scan to review the study.      Leotis Pain 09/19/2017, 3:01 PM   This note was created with Dragon medical transcription system.  Any errors from dictation are unintentional.

## 2017-09-19 NOTE — Assessment & Plan Note (Signed)
Recommend: The patient has large abdominal aortic aneurysm that is incompletely visualized on a limited noncontrasted CT scan of the pelvis only.  The upper extent of the aneurysm has not been evaluated.  The patient is otherwise in reasonable health.   Therefore, the patient should undergo endovascular repair of the AAA to prevent future leathal rupture.   Patient will require CT angiography of the abdomen and pelvis in order to appropriately plan repair of the AAA.  The risks and benefits as well as the alternative therapies was discussed in detail with the patient. All questions were answered. The patient agrees to move forward the AAA repair and therefore with CT scan.  The patient will follow up with me in the office after the CT scan to review the study.

## 2017-09-19 NOTE — Assessment & Plan Note (Signed)
blood pressure control important in reducing the progression of atherosclerotic disease. On appropriate oral medications.  

## 2017-09-19 NOTE — Patient Instructions (Signed)
Abdominal Aortic Aneurysm Blood pumps away from the heart through tubes (blood vessels) called arteries. Aneurysms are weak or damaged places in the wall of an artery. It bulges out like a balloon. An abdominal aortic aneurysm happens in the main artery of the body (aorta). It can burst or tear, causing bleeding inside the body. This is an emergency. It needs treatment right away. What are the causes? The exact cause is unknown. Things that could cause this problem include:  Fat and other substances building up in the lining of a tube.  Swelling of the walls of a blood vessel.  Certain tissue diseases.  Belly (abdominal) trauma.  An infection in the main artery of the body.  What increases the risk? There are things that make it more likely for you to have an aneurysm. These include:  Being over the age of 81 years old.  Having high blood pressure (hypertension).  Being a female.  Being white.  Being very overweight (obese).  Having a family history of aneurysm.  Using tobacco products.  What are the signs or symptoms? Symptoms depend on the size of the aneurysm and how fast it grows. There may not be symptoms. If symptoms occur, they can include:  Pain (belly, side, lower back, or groin).  Feeling full after eating a small amount of food.  Feeling sick to your stomach (nauseous), throwing up (vomiting), or both.  Feeling a lump in your belly that feels like it is beating (pulsating).  Feeling like you will pass out (faint).  How is this treated?  Medicine to control blood pressure and pain.  Imaging tests to see if the aneurysm gets bigger.  Surgery. How is this prevented? To lessen your chance of getting this condition:  Stop smoking. Stop chewing tobacco.  Limit or avoid alcohol.  Keep your blood pressure, blood sugar, and cholesterol within normal limits.  Eat less salt.  Eat foods low in saturated fats and cholesterol. These are found in animal and  whole dairy products.  Eat more fiber. Fiber is found in whole grains, vegetables, and fruits.  Keep a healthy weight.  Stay active and exercise often.  This information is not intended to replace advice given to you by your health care provider. Make sure you discuss any questions you have with your health care provider. Document Released: 01/14/2013 Document Revised: 02/25/2016 Document Reviewed: 10/19/2012 Elsevier Interactive Patient Education  2017 Elsevier Inc.  

## 2017-10-03 DIAGNOSIS — I714 Abdominal aortic aneurysm, without rupture, unspecified: Secondary | ICD-10-CM

## 2017-10-03 HISTORY — DX: Abdominal aortic aneurysm, without rupture, unspecified: I71.40

## 2017-10-03 HISTORY — DX: Abdominal aortic aneurysm, without rupture: I71.4

## 2017-10-11 ENCOUNTER — Telehealth: Payer: Self-pay

## 2017-10-11 NOTE — Telephone Encounter (Signed)
° °  [facility] Pre-operative Risk Assessment    Request for surgical clearance:  1. What type of surgery is being performed? Endovascular AAA repair   2. When is this surgery scheduled? Not listed   3. Are there any medications that need to be held prior to surgery and how long? Not listed   4. Practice name and name of physician performing surgery? [facility], [place]    5. What is your office phone and fax number? Phone: [phone] Fax : [phone]   6. Anesthesia type (None, local, MAC, general) ? Not listed    Debra Bartlett 10/11/2017, 4:33 PM  _________________________________________________________________   (provider comments below)

## 2017-10-11 NOTE — Telephone Encounter (Signed)
This patient needs to be seen sooner than 11/14/17 due to the nature of what we're seeing her for.  Ok to schedule for Dr. Okey DupreEnd/ Dr. Mariah MillingGollan on 10/18/17 in the 7 day hold slot.  Please call her to arrange.   Thanks!

## 2017-10-13 ENCOUNTER — Ambulatory Visit (INDEPENDENT_AMBULATORY_CARE_PROVIDER_SITE_OTHER): Payer: Medicare HMO | Admitting: Vascular Surgery

## 2017-10-13 NOTE — Telephone Encounter (Signed)
I attempted to call the patient today to r/s her appointment to sooner- no answer & no voice mail.   Will try back.

## 2017-10-16 ENCOUNTER — Ambulatory Visit
Admission: RE | Admit: 2017-10-16 | Discharge: 2017-10-16 | Disposition: A | Discharge status: Home / Self Care | Payer: Medicare HMO | Source: Ambulatory Visit | Attending: Vascular Surgery | Admitting: Vascular Surgery

## 2017-10-16 DIAGNOSIS — I713 Abdominal aortic aneurysm, ruptured, unspecified: Secondary | ICD-10-CM

## 2017-10-16 DIAGNOSIS — Q272 Other congenital malformations of renal artery: Secondary | ICD-10-CM | POA: Insufficient documentation

## 2017-10-16 DIAGNOSIS — I517 Cardiomegaly: Secondary | ICD-10-CM | POA: Insufficient documentation

## 2017-10-16 DIAGNOSIS — I2510 Atherosclerotic heart disease of native coronary artery without angina pectoris: Secondary | ICD-10-CM | POA: Diagnosis not present

## 2017-10-16 DIAGNOSIS — I714 Abdominal aortic aneurysm, without rupture: Secondary | ICD-10-CM | POA: Diagnosis not present

## 2017-10-16 LAB — POCT I-STAT CREATININE: Creatinine, Ser: 0.8 mg/dL (ref 0.44–1.00)

## 2017-10-16 MED ORDER — IOPAMIDOL (ISOVUE-370) INJECTION 76%
100.0000 mL | Freq: Once | INTRAVENOUS | Status: AC | PRN
  Administered 2017-10-16: 100 mL via INTRAVENOUS

## 2017-10-16 NOTE — Telephone Encounter (Signed)
Spoke with patient and reviewed that we were able to move her appointment up to see Dr. Kirke CorinArida next Thursday 10/19/17 at 09:20 AM. She asked several times if we were connected to what she had done this morning. She was confused regarding two doctors and reviewed information with her for over 20 minutes. Reviewed both appointments with dates, times, and locations numerous times and spelled names for her. She verbalized understanding of all instructions with no further questions at this time. Provided her with number to call back if any further questions.

## 2017-10-19 ENCOUNTER — Encounter: Payer: Self-pay | Admitting: Cardiovascular Disease

## 2017-10-19 ENCOUNTER — Ambulatory Visit: Payer: Medicare HMO | Admitting: Cardiovascular Disease

## 2017-10-19 ENCOUNTER — Other Ambulatory Visit: Payer: Self-pay

## 2017-10-19 VITALS — BP 160/64 | HR 66 | Ht 68.0 in | Wt 141.8 lb

## 2017-10-19 DIAGNOSIS — I714 Abdominal aortic aneurysm, without rupture, unspecified: Secondary | ICD-10-CM

## 2017-10-19 DIAGNOSIS — R011 Cardiac murmur, unspecified: Secondary | ICD-10-CM

## 2017-10-19 DIAGNOSIS — R0989 Other specified symptoms and signs involving the circulatory and respiratory systems: Secondary | ICD-10-CM | POA: Diagnosis not present

## 2017-10-19 DIAGNOSIS — Z01810 Encounter for preprocedural cardiovascular examination: Secondary | ICD-10-CM

## 2017-10-19 NOTE — Progress Notes (Signed)
Cardiology Office Note   Date:  10/19/2017   ID:  Debra Bartlett, DOB [birth year], MRN [id number]  PCP:  Debra Shaggy, MD  Cardiologist:   Debra Bears, MD   Chief Complaint  Patient presents with  . other    Cardiac clearance. Meds reviewed verbally with pt.      History of Present Illness: Debra Bartlett is a 84 y.o. female who was referred by Dr. Wyn Quaker for preoperative cardiovascular evaluation before planned endovascular repair of AAA.  The patient has no previous cardiac history and has been relatively healthy throughout her life other than hypertension hypothyroidism.  She is not aware of her cardiac murmur.  She is not diabetic.  She is a lifelong non-smoker and has no family history of premature coronary artery disease. She was recently found to have a large abdominal aortic aneurysm at 7 cm.  CT scan did show coronary calcifications.  The patient has no prior history of ischemic heart disease.  She denies any chest pain or shortness of breath.  She is overall active and walks regularly at the mall for exercise.  She dances frequently with no significant exertional symptoms.    Past Medical History:  Diagnosis Date  . Hypertension     Past Surgical History:  Procedure Laterality Date  . NO PAST SURGERIES       Current Outpatient Medications  Medication Sig Dispense Refill  . acetaminophen (TYLENOL) 500 MG tablet Take 500-1,000 mg by mouth every 6 (six) hours as needed.    Marland Kitchen levothyroxine (SYNTHROID, LEVOTHROID) 100 MCG tablet Take 1 tablet by mouth daily.    Marland Kitchen lisinopril (PRINIVIL,ZESTRIL) 5 MG tablet Take 1 tablet by mouth daily.     No current facility-administered medications for this visit.     Allergies:   Patient has no known allergies.    Social History:  The patient  reports that she has quit smoking. Her smoking use included cigarettes. She has quit using smokeless tobacco. She reports that she does not drink alcohol or use drugs.   Family History:  The  patient's Family history is unknown by patient.    ROS:  Please see the history of present illness.   Otherwise, review of systems are positive for none.   All other systems are reviewed and negative.    PHYSICAL EXAM: VS:  BP (!) 160/64 (BP Location: Right Arm, Patient Position: Sitting, Cuff Size: Normal)   Pulse 66   Ht 5\' 8"  (1.727 m)   Wt 141 lb 12 oz (64.3 kg)   BMI 21.55 kg/m  , BMI Body mass index is 21.55 kg/m. GEN: Well nourished, well developed, in no acute distress  HEENT: normal  Neck: no JVD, or masses.  Bilateral carotid bruits versus radiated murmur Cardiac: RRR; no  rubs, or gallops,no edema .  3 out of 6 crescendo decrescendo systolic murmur in the aortic area which is late peaking with very diminished S2. Respiratory:  clear to auscultation bilaterally, normal work of breathing GI: soft, nontender, nondistended, + BS MS: no deformity or atrophy  Skin: warm and dry, no rash Neuro:  Strength and sensation are intact Psych: euthymic mood, full affect   EKG:  EKG is ordered today. The ekg ordered today demonstrates normal sinus rhythm with PACs.  LVH with repolarization abnormalities.   Recent Labs: 08/28/2017: BUN 17; Hemoglobin 12.7; Platelets 204; Potassium 4.2; Sodium 138 10/16/2017: Creatinine, Ser 0.80    Lipid Panel No results found for: CHOL,  TRIG, HDL, CHOLHDL, VLDL, LDLCALC, LDLDIRECT    Wt Readings from Last 3 Encounters:  10/19/17 141 lb 12 oz (64.3 kg)  09/19/17 141 lb (64 kg)  08/28/17 145 lb (65.8 kg)       PAD Screen 10/19/2017  Previous PAD dx? No  Previous surgical procedure? No  Pain with walking? Yes  Subsides with rest? Yes  Feet/toe relief with dangling? No  Painful, non-healing ulcers? No  Extremities discolored? Yes      ASSESSMENT AND PLAN:  1.  The preoperative cardiovascular evaluation for endovascular repair of large abdominal aortic aneurysm: The patient overall has no anginal symptoms and EKG does not show  ischemic changes other than LVH with repolarization abnormalities.  Her functional capacity is good in spite of her age.  Stress testing is not needed. Unfortunately, her cardiac exam is suggestive of at least moderate aortic stenosis and thus I recommend evaluation with an echocardiogram. Given the size of her abdominal aortic aneurysm, that might have to be repaired as a priority regardless of cardiac testing results.  2.  Carotid bruits versus radiated murmur: I requested carotid Doppler.  3.  Abdominal aortic aneurysm: Plans for endovascular repair by Debra Bartlett.  4.  Essential hypertension: Blood pressure is mildly elevated today.  Made no change in her medications    Disposition:   FU with me as needed.   Signed,  Debra BearsMuhammad [name], MD  10/19/2017 1:43 PM    [facility]

## 2017-10-19 NOTE — Patient Instructions (Addendum)
Medication Instructions:  Your physician recommends that you continue on your current medications as directed. Please refer to the Current Medication list given to you today.   Labwork: none  Testing/Procedures: Your physician has requested that you have an echocardiogram. Echocardiography is a painless test that uses sound waves to create images of your heart. It provides your doctor with information about the size and shape of your heart and how well your heart's chambers and valves are working. This procedure takes approximately one hour. There are no restrictions for this procedure.  Your physician has requested that you have a carotid duplex. This test is an ultrasound of the carotid arteries in your neck. It looks at blood flow through these arteries that supply the brain with blood. Allow one hour for this exam. There are no restrictions or special instructions.      Follow-Up: Your physician recommends that you schedule a follow-up appointment as needed.    Any Other Special Instructions Will Be Listed Below (If Applicable).     If you need a refill on your cardiac medications before your next appointment, please call your pharmacy.  Echocardiogram An echocardiogram, or echocardiography, uses sound waves (ultrasound) to produce an image of your heart. The echocardiogram is simple, painless, obtained within a short period of time, and offers valuable information to your health care provider. The images from an echocardiogram can provide information such as:  Evidence of coronary artery disease (CAD).  Heart size.  Heart muscle function.  Heart valve function.  Aneurysm detection.  Evidence of a past heart attack.  Fluid buildup around the heart.  Heart muscle thickening.  Assess heart valve function.  Tell a health care provider about:  Any allergies you have.  All medicines you are taking, including vitamins, herbs, eye drops, creams, and over-the-counter  medicines.  Any problems you or family members have had with anesthetic medicines.  Any blood disorders you have.  Any surgeries you have had.  Any medical conditions you have.  Whether you are pregnant or may be pregnant. What happens before the procedure? No special preparation is needed. Eat and drink normally. What happens during the procedure?  In order to produce an image of your heart, gel will be applied to your chest and a wand-like tool (transducer) will be moved over your chest. The gel will help transmit the sound waves from the transducer. The sound waves will harmlessly bounce off your heart to allow the heart images to be captured in real-time motion. These images will then be recorded.  You may need an IV to receive a medicine that improves the quality of the pictures. What happens after the procedure? You may return to your normal schedule including diet, activities, and medicines, unless your health care provider tells you otherwise. This information is not intended to replace advice given to you by your health care provider. Make sure you discuss any questions you have with your health care provider. Document Released: 09/16/2000 Document Revised: 05/07/2016 Document Reviewed: 05/27/2013 Elsevier Interactive Patient Education  2017 [employer]

## 2017-10-20 ENCOUNTER — Encounter (INDEPENDENT_AMBULATORY_CARE_PROVIDER_SITE_OTHER): Payer: Self-pay | Admitting: Vascular Surgery

## 2017-10-20 ENCOUNTER — Encounter (INDEPENDENT_AMBULATORY_CARE_PROVIDER_SITE_OTHER): Payer: Self-pay

## 2017-10-20 ENCOUNTER — Ambulatory Visit (INDEPENDENT_AMBULATORY_CARE_PROVIDER_SITE_OTHER): Payer: Medicare HMO | Admitting: Vascular Surgery

## 2017-10-20 VITALS — BP 159/66 | HR 55 | Resp 16 | Wt 142.6 lb

## 2017-10-20 DIAGNOSIS — I10 Essential (primary) hypertension: Secondary | ICD-10-CM | POA: Diagnosis not present

## 2017-10-20 DIAGNOSIS — I714 Abdominal aortic aneurysm, without rupture, unspecified: Secondary | ICD-10-CM

## 2017-10-20 NOTE — Assessment & Plan Note (Signed)
blood pressure control important in reducing the progression of atherosclerotic disease and aneurysmal degeneration. On appropriate oral medications.  

## 2017-10-20 NOTE — Assessment & Plan Note (Signed)
She has undergone a CT angiogram which I have independently reviewed.  This demonstrates a 6.8 cm abdominal aortic aneurysm.  Although the proximal neck is somewhat tapered and angulated, she does appear to have an adequate neck for endovascular repair.  There is no evidence of rupture or extravasation.  Recommend: The aneurysm is > 5 cm and therefore should undergo repair. Patient is status post CT scan of the abdominal aorta. The patient is a candidate for endovascular repair.   He will require cardiac clearance.   The patient will continue antiplatelet therapy as prescribed (since the patient is undergoing endovascular repair as opposed to open repair) as well as aggressive management of hyperlipidemia. Exercise is again strongly encouraged.   The patient is reminded that lifetime routine surveillance is a necessity with an endograft.   The risks and benefits of AAA repair are reviewed with the patient.  All questions are answered.  Alternative therapies are also discussed.  The patient agrees to proceed with endovascular aneurysm repair.  Patient will follow-up with me in the office after the surgery.

## 2017-10-20 NOTE — Progress Notes (Signed)
MRN : [id number]  Debra Bartlett is a 84 y.o. (1924-10-04) female who presents with chief complaint of  Chief Complaint  Patient presents with  . Follow-up    ct results  .  History of Present Illness: Patient returns today in follow up of her abdominal aortic aneurysm.  She has undergone a CT angiogram which I have independently reviewed.  This demonstrates a 6.8 cm abdominal aortic aneurysm.  Although the proximal neck is somewhat tapered and angulated, she does appear to have an adequate neck for endovascular repair.  There is no evidence of rupture or extravasation.  She does not have any obvious symptoms.  Past Medical History:  Diagnosis Date  . Hypertension          Past Surgical History:  Procedure Laterality Date  . NO PAST SURGERIES     Family History No bleeding disorders, clotting disorders, autoimmune diseases, or aneurysms  Social History Social History        Tobacco Use  . Smoking status: Former Smoker    Types: Cigarettes  . Smokeless tobacco: Former Network engineer Use Topics  . Alcohol use: No    Frequency: Never  . Drug use: No     No Known Allergies        Current Outpatient Medications  Medication Sig Dispense Refill  . acetaminophen (TYLENOL) 500 MG tablet Take 500-1,000 mg by mouth every 6 (six) hours as needed.    Marland Kitchen levothyroxine (SYNTHROID, LEVOTHROID) 100 MCG tablet Take 1 tablet by mouth daily.    Marland Kitchen lisinopril (PRINIVIL,ZESTRIL) 5 MG tablet Take 1 tablet by mouth daily.     No current facility-administered medications for this visit.       REVIEW OF SYSTEMS (Negative unless checked)  Constitutional: _0 Weight loss  _1 Fever  _2 Chills Cardiac: _3 Chest pain   _4 Chest pressure   _5 Palpitations   _6 Shortness of breath when laying flat   _7 Shortness of breath at rest   _8 Shortness of breath with exertion. Vascular:  _9 Pain in legs with walking   _10 Pain in legs at rest   _11 Pain in legs when laying flat    _12 Claudication   _13 Pain in feet when walking  _14 Pain in feet at rest  _15 Pain in feet when laying flat   _16 History of DVT   _17 Phlebitis   _18 Swelling in legs   _19 Varicose veins   _20 Non-healing ulcers Pulmonary:   _21 Uses home oxygen   _22 Productive cough   _23 Hemoptysis   _24 Wheeze  _25 COPD   _26 Asthma Neurologic:  _27 Dizziness  _28 Blackouts   _29 Seizures   _30 History of stroke   _31 History of TIA  _32 Aphasia   _33 Temporary blindness   _34 Dysphagia   _35 Weakness or numbness in arms   _36 Weakness or numbness in legs Musculoskeletal:  _37 Arthritis   _38 Joint swelling   _39 Joint pain   _40 Low back pain Hematologic:  _41 Easy bruising  _42 Easy bleeding   _43 Hypercoagulable state   _44 Anemic  _45 Hepatitis Gastrointestinal:  _46 Blood in stool   _47 Vomiting blood  _48 Gastroesophageal reflux/heartburn   _49 Abdominal pain Genitourinary:  _50 Chronic kidney disease   _51 Difficult urination  _52 Frequent urination  _53 Burning with urination   _54 Hematuria Skin:  _55 Rashes   _56 Ulcers   _57 Wounds Psychological:  _58 History of anxiety   _59  History of major depression.     Physical Examination  BP (!) 159/66 (BP Location: Right Arm)   Pulse (!) 55   Resp 16   Wt 64.7 kg (142 lb 9.6 oz)   BMI 21.68 kg/m  Gen:  WD/WN, NAD.  Appears younger than stated age Head: [place]/AT, No temporalis wasting. Ear/Nose/Throat: Hearing grossly intact, nares w/o erythema or drainage, trachea midline Eyes: Conjunctiva clear. Sclera non-icteric Neck: Supple.  No JVD.  Pulmonary:  Good air movement, no use of accessory muscles.  Cardiac: RRR, systolic murmur Vascular:  Vessel Right Left  Radial Palpable Palpable                                   Gastrointestinal: soft, non-tender/non-distended.  Increased aortic impulse Musculoskeletal: M/S 5/5 throughout.  No deformity or atrophy.  Trace lower extremity edema. Neurologic: Sensation grossly intact in extremities.  Symmetrical.  Speech is fluent.  Psychiatric: Judgment intact, Mood & affect appropriate  for pt's clinical situation. Dermatologic: No rashes or ulcers noted.  No cellulitis or open wounds.       Labs Recent Results (from the past 2160 hour(s))  Basic metabolic panel     Status: None   Collection Time: 08/28/17  3:32 PM  Result Value Ref Range   Sodium 138 135 - 145 mmol/L   Potassium 4.2 3.5 - 5.1 mmol/L   Chloride 102 101 - 111 mmol/L   CO2 28 22 - 32 mmol/L   Glucose, Bld 75 65 - 99 mg/dL   BUN 17 6 - 20 mg/dL   Creatinine, Ser 0.64 0.44 - 1.00 mg/dL   Calcium 9.4 8.9 - 10.3 mg/dL   GFR calc non Af Amer >60 >60 mL/min   GFR calc Af Amer >60 >60 mL/min    Comment: (NOTE) The eGFR has been calculated using the CKD EPI equation. This calculation has not been validated in all clinical situations. eGFR's persistently <60 mL/min signify possible Chronic Kidney Disease.    Anion gap 8 5 - 15  CBC     Status: Abnormal   Collection Time: 08/28/17  3:32 PM  Result Value Ref Range   WBC 8.6 3.6 - 11.0 K/uL   RBC 4.05 3.80 - 5.20 MIL/uL   Hemoglobin 12.7 12.0 - 16.0 g/dL   HCT 37.4 35.0 - 47.0 %   MCV 92.3 80.0 - 100.0 fL   MCH 31.4 26.0 - 34.0 pg   MCHC 34.0 32.0 - 36.0 g/dL   RDW 14.6 (H) 11.5 - 14.5 %   Platelets 204 150 - 440 K/uL  I-STAT creatinine     Status: None   Collection Time: 10/16/17  8:58 AM  Result Value Ref Range   Creatinine, Ser 0.80 0.44 - 1.00 mg/dL    Radiology Ct Angio Abd/pel W/ And/or W/o  Result Date: 10/16/2017 CLINICAL DATA:  84 year old female with abdominal aortic aneurysm EXAM: CT ANGIOGRAPHY ABDOMEN AND PELVIS WITH CONTRAST AND WITHOUT CONTRAST TECHNIQUE: Multidetector CT imaging of the abdomen and pelvis was performed using the standard protocol during bolus administration of intravenous contrast. Multiplanar reconstructed images and MIPs were obtained and reviewed to evaluate the vascular anatomy. CONTRAST:  173m ISOVUE-370 IOPAMIDOL (ISOVUE-370) INJECTION 76% COMPARISON:  CT scan of the abdomen and pelvis 08/28/2017 FINDINGS:  VASCULAR Aorta: Fusiform aneurysmal dilatation of the infrarenal abdominal aorta with a maximal diameter of 7.0 x 6.9 cm (confirmed on the coronal and sagittal reformatted images. There is crescentic wall adherent mural thrombus. The abdominal aorta is also tortuous. Celiac: No evidence of aneurysm, dissection or significant stenosis. Incidentally, the lateral segmental branch of the left hepatic artery is replaced to the left gastric artery. SMA: Heterogeneous atherosclerotic plaque  at the origin results in mild stenosis. No aneurysm or dissection. Renals: Solitary left renal artery. Atherosclerotic plaque at the origin without significant stenosis. Atherosclerotic plaque at the origin of the dominant right renal artery results in mild stenosis. There is an accessory renal artery supplying the lower pole and a portion of the interpolar kidney. IMA: Appears occluded at the origin. Inflow: No aneurysm or dissection. Heterogeneous atherosclerotic plaque without significant stenosis. Both internal iliac arteries are patent. Proximal Outflow: Bilateral common femoral and visualized portions of the superficial and profunda femoral arteries are patent without evidence of aneurysm, dissection, vasculitis or significant stenosis. Veins: No focal venous abnormality. Review of the MIP images confirms the above findings. NON-VASCULAR Lower chest: Calcified and slightly thickened aortic valve. Mild cardiomegaly with right atrial enlargement. Calcifications present along the course of the coronary arteries. No pericardial effusion. Unremarkable visualized distal thoracic esophagus. No suspicious pulmonary mass or nodule. Hepatobiliary: Normal hepatic contour and morphology. No discrete hepatic lesions. Normal appearance of the gallbladder. No intra or extrahepatic biliary ductal dilatation. Pancreas: Unremarkable. No pancreatic ductal dilatation or surrounding inflammatory changes. Spleen: Normal in size without focal  abnormality. Adrenals/Urinary Tract: Adrenal glands are unremarkable. Kidneys are normal, without renal calculi, focal lesion, or hydronephrosis. Bladder is unremarkable. Stomach/Bowel: Stomach is within normal limits. Appendix appears normal. No evidence of bowel wall thickening, distention, or inflammatory changes. Lymphatic: No suspicious lymphadenopathy. Reproductive: Uterus and bilateral adnexa are unremarkable. Other: No abdominal wall hernia or abnormality. No abdominopelvic ascites. Musculoskeletal: No acute or significant osseous findings. Multilevel degenerative disc disease. IMPRESSION: VASCULAR 1. Fusiform infrarenal abdominal aortic aneurysm with a maximal diameter of 7 cm. 2. Accessory right renal artery supplying the right lower pole in a portion of the interpolar kidney (approximately 25-30% right renal volume). 3. Lateral segmental branch of the left hepatic artery is replaced to the right gastric artery. 4. Coronary artery calcifications. 5. Calcified and thickened aortic valve. If there is clinical concern for underlying stenosis or regurgitation, echocardiography could further evaluate. 6. Cardiomegaly with right atrial enlargement. NON-VASCULAR 1. No acute or significant abnormality. 2. Multilevel degenerative disc disease. Signed, Criselda Peaches, MD [facility] Radiology Electronically Signed   By: Jacqulynn Cadet M.D.   On: 10/16/2017 09:32   Assessment/Plan  Hypertension blood pressure control important in reducing the progression of atherosclerotic disease and aneurysmal degeneration. On appropriate oral medications.   AAA (abdominal aortic aneurysm) without rupture ([place]) She has undergone a CT angiogram which I have independently reviewed.  This demonstrates a 6.8 cm abdominal aortic aneurysm.  Although the proximal neck is somewhat tapered and angulated, she does appear to have an adequate neck for endovascular repair.  There  is no evidence of rupture or extravasation.  Recommend: The aneurysm is > 5 cm and therefore should undergo repair. Patient is status post CT scan of the abdominal aorta. The patient is a candidate for endovascular repair.   He will require cardiac clearance.   The patient will continue antiplatelet therapy as prescribed (since the patient is undergoing endovascular repair as opposed to open repair) as well as aggressive management of hyperlipidemia. Exercise is again strongly encouraged.   The patient is reminded that lifetime routine surveillance is a necessity with an endograft.   The risks and benefits of AAA repair are reviewed with the patient.  All questions are answered.  Alternative therapies are also discussed.  The patient agrees to proceed with endovascular aneurysm repair.  Patient will follow-up with me  in the office after the surgery.    Leotis Pain, MD  10/20/2017 11:52 AM    This note was created with Dragon medical transcription system.  Any errors from dictation are purely unintentional

## 2017-10-20 NOTE — Patient Instructions (Signed)
Abdominal Aortic Aneurysm Endograft Repair, Care After This sheet gives you information about how to care for yourself after your procedure. Your health care provider may also give you more specific instructions. If you have problems or questions, contact your health care provider. What can I expect after the procedure? After the procedure, it is common to have:  Pain or soreness at the incision site.  Tiredness (fatigue).  Follow these instructions at home: Activity  Get plenty of rest.  Follow instructions from your health care provider about how much you should move around and how far you should go when you take short walks. Start walking farther when your health care provider says it is okay.  Limit your activities as told by your health care provider.  Return to your normal activities as told by your health care provider. Ask your health care provider what activities are safe for you. Incision care   Follow instructions from your health care provider about how to take care of your incisions. Make sure you: ? Wash your hands with soap and water before you change your bandage (dressing). If soap and water are not available, use hand sanitizer. ? Change your dressing as told by your health care provider. ? Leave stitches (sutures), skin glue, or adhesive strips in place. These skin closures may need to stay in place for 2 weeks or longer. If adhesive strip edges start to loosen and curl up, you may trim the loose edges. Do not remove adhesive strips completely unless your health care provider tells you to do that.  Keep the incision area clean and dry.  Do not take baths, swim, or use a hot tub until your health care provider approves. Ask your health care provider if you can take showers. You may only be allowed to take sponge baths for bathing.  Check your incision area every day for signs of infection. Check for: ? More redness, swelling, or pain. ? More fluid or  blood. ? Warmth. ? Pus or a bad smell. Lifestyle  Do not use any products that contain nicotine or tobacco, such as cigarettes and e-cigarettes. If you need help quitting, ask your health care provider.  Make any other lifestyle changes that your health care provider suggests. These may include: ? Keeping your blood pressure under control. ? Finding ways to lower stress. ? Eating healthy foods that are good for your heart, such as vegetables, fruits, and whole grains that add fiber to your diet. ? Getting regular exercise. General instructions  Take over-the-counter and prescription medicines only as told by your health care provider.  Keep all follow-up visits as told by your health care provider. This is important. Contact a health care provider if:  You have pain in your abdomen, chest, or back.  You have more redness, swelling, or pain around an incision.  You have more fluid or blood coming from an incision.  Your incision feels warm to the touch.  You have pus or a bad smell coming from an incision.  You have a fever. Get help right away if:  You have trouble breathing.  You suddenly have pain in your legs or you have trouble moving either of your legs.  You faint or you feel very light-headed. This information is not intended to replace advice given to you by your health care provider. Make sure you discuss any questions you have with your health care provider. Document Released: 06/10/2015 Document Revised: 04/08/2016 Document Reviewed: 12/14/2015 Elsevier Interactive Patient Education    2018 Elsevier Inc.  

## 2017-10-27 ENCOUNTER — Ambulatory Visit (INDEPENDENT_AMBULATORY_CARE_PROVIDER_SITE_OTHER): Payer: Medicare HMO

## 2017-10-27 ENCOUNTER — Other Ambulatory Visit (INDEPENDENT_AMBULATORY_CARE_PROVIDER_SITE_OTHER): Payer: Self-pay | Admitting: Vascular Surgery

## 2017-10-27 ENCOUNTER — Other Ambulatory Visit: Payer: Self-pay

## 2017-10-27 DIAGNOSIS — R011 Cardiac murmur, unspecified: Secondary | ICD-10-CM

## 2017-10-27 DIAGNOSIS — R0989 Other specified symptoms and signs involving the circulatory and respiratory systems: Secondary | ICD-10-CM | POA: Diagnosis not present

## 2017-10-30 LAB — VAS US CAROTID
LCCADSYS: -64 cm/s
LCCAPDIAS: 9 cm/s
LEFT VERTEBRAL DIAS: -10 cm/s
LICADSYS: -74 cm/s
Left CCA dist dias: -7 cm/s
Left CCA prox sys: 60 cm/s
Left ICA dist dias: -12 cm/s
Left ICA prox dias: -7 cm/s
Left ICA prox sys: -44 cm/s
RCCADSYS: -64 cm/s
RCCAPDIAS: 9 cm/s
RIGHT VERTEBRAL DIAS: -7 cm/s
Right CCA prox sys: 48 cm/s

## 2017-10-31 ENCOUNTER — Other Ambulatory Visit: Payer: Self-pay

## 2017-10-31 ENCOUNTER — Encounter
Admission: RE | Admit: 2017-10-31 | Discharge: 2017-10-31 | Disposition: A | Discharge status: Home / Self Care | Payer: Medicare HMO | Source: Ambulatory Visit | Attending: Vascular Surgery | Admitting: Vascular Surgery

## 2017-10-31 ENCOUNTER — Telehealth: Payer: Self-pay | Admitting: Cardiovascular Disease

## 2017-10-31 DIAGNOSIS — I714 Abdominal aortic aneurysm, without rupture: Secondary | ICD-10-CM | POA: Insufficient documentation

## 2017-10-31 DIAGNOSIS — I10 Essential (primary) hypertension: Secondary | ICD-10-CM | POA: Diagnosis not present

## 2017-10-31 DIAGNOSIS — Z01810 Encounter for preprocedural cardiovascular examination: Secondary | ICD-10-CM | POA: Insufficient documentation

## 2017-10-31 DIAGNOSIS — Z01812 Encounter for preprocedural laboratory examination: Secondary | ICD-10-CM | POA: Diagnosis present

## 2017-10-31 HISTORY — DX: Abdominal aortic aneurysm, without rupture: I71.4

## 2017-10-31 HISTORY — DX: Hypothyroidism, unspecified: E03.9

## 2017-10-31 LAB — CBC WITH DIFFERENTIAL/PLATELET
BASOS ABS: 0.1 10*3/uL (ref 0–0.1)
Basophils Relative: 1 %
Eosinophils Absolute: 0.2 10*3/uL (ref 0–0.7)
Eosinophils Relative: 2 %
HEMATOCRIT: 40.5 % (ref 35.0–47.0)
Hemoglobin: 13.1 g/dL (ref 12.0–16.0)
LYMPHS PCT: 27 %
Lymphs Abs: 1.8 10*3/uL (ref 1.0–3.6)
MCH: 30.1 pg (ref 26.0–34.0)
MCHC: 32.4 g/dL (ref 32.0–36.0)
MCV: 93.1 fL (ref 80.0–100.0)
Monocytes Absolute: 0.8 10*3/uL (ref 0.2–0.9)
Monocytes Relative: 11 %
NEUTROS ABS: 4.1 10*3/uL (ref 1.4–6.5)
Neutrophils Relative %: 59 %
PLATELETS: 88 10*3/uL — AB (ref 150–440)
RBC: 4.35 MIL/uL (ref 3.80–5.20)
RDW: 15.4 % — AB (ref 11.5–14.5)
WBC: 6.9 10*3/uL (ref 3.6–11.0)

## 2017-10-31 LAB — BASIC METABOLIC PANEL
Anion gap: 6 (ref 5–15)
BUN: 16 mg/dL (ref 6–20)
CHLORIDE: 102 mmol/L (ref 101–111)
CO2: 29 mmol/L (ref 22–32)
Calcium: 9.2 mg/dL (ref 8.9–10.3)
Creatinine, Ser: 0.71 mg/dL (ref 0.44–1.00)
GFR calc Af Amer: >60 mL/min (ref >60)
GFR calc non Af Amer: >60 mL/min (ref >60)
GLUCOSE: 92 mg/dL (ref 65–99)
POTASSIUM: 3.8 mmol/L (ref 3.5–5.1)
Sodium: 137 mmol/L (ref 135–145)

## 2017-10-31 LAB — PROTIME-INR
INR: 0.99
Prothrombin Time: 13 seconds (ref 11.4–15.2)

## 2017-10-31 LAB — APTT: aPTT: 30 seconds (ref 24–36)

## 2017-10-31 LAB — SURGICAL PCR SCREEN
MRSA, PCR: NEGATIVE
Staphylococcus aureus: NEGATIVE

## 2017-10-31 NOTE — Telephone Encounter (Signed)
Clearance from [facility] for 2/6 AAA repair in Dr. Jari SportsmanArida's basket awaiting echo and carotid results.

## 2017-10-31 NOTE — Patient Instructions (Signed)
[facility]  Your procedure is scheduled with Dr. Gilda CreaseSchnier                               On:  Date:    11/08/2017                    Follow instructions given to you by Dr. Gilda CreaseSchnier  Please call Dr Wyn Quakerew and Dr Marijean HeathSchnier's office with any questions or concerns: [phone].  You will need to have someone drive you home and stay with you the night of the procedure.

## 2017-11-02 NOTE — Telephone Encounter (Signed)
Notes recorded by Iran OuchArida, Muhammad A, MD on 11/02/2017 at 5:11 PM EST Inform patient that echo showed normal ejection fraction. However, as expected she has moderate to severe aortic stenosis. Thus, she is at moderate risk for endovascular repair of aortic aneurysm. I do not think she can tolerate prolonged anesthesia and should be cautious about hypotension. I am forwarding this to Dr. Wyn Quakerew and Dr. Gilda CreaseSchnier.

## 2017-11-07 MED ORDER — CEFAZOLIN SODIUM-DEXTROSE 2-4 GM/100ML-% IV SOLN
2.0000 g | INTRAVENOUS | Status: AC
  Administered 2017-11-08: 2 g via INTRAVENOUS

## 2017-11-08 ENCOUNTER — Inpatient Hospital Stay: Payer: Medicare HMO | Admitting: Anesthesiology

## 2017-11-08 ENCOUNTER — Encounter: Payer: Self-pay | Admitting: *Deleted

## 2017-11-08 ENCOUNTER — Inpatient Hospital Stay
Admission: RE | Admit: 2017-11-08 | Discharge: 2017-11-09 | DRG: 269 | Disposition: A | Discharge status: Home / Self Care | Payer: Medicare HMO | Source: Ambulatory Visit | Attending: Vascular Surgery | Admitting: Vascular Surgery

## 2017-11-08 ENCOUNTER — Other Ambulatory Visit: Payer: Self-pay

## 2017-11-08 ENCOUNTER — Encounter
Admission: RE | Disposition: A | Discharge status: Home / Self Care | Payer: Self-pay | Source: Ambulatory Visit | Attending: Vascular Surgery

## 2017-11-08 DIAGNOSIS — I714 Abdominal aortic aneurysm, without rupture, unspecified: Secondary | ICD-10-CM | POA: Diagnosis present

## 2017-11-08 DIAGNOSIS — Z87891 Personal history of nicotine dependence: Secondary | ICD-10-CM | POA: Diagnosis not present

## 2017-11-08 DIAGNOSIS — N3000 Acute cystitis without hematuria: Secondary | ICD-10-CM | POA: Diagnosis not present

## 2017-11-08 DIAGNOSIS — K219 Gastro-esophageal reflux disease without esophagitis: Secondary | ICD-10-CM | POA: Diagnosis present

## 2017-11-08 DIAGNOSIS — I10 Essential (primary) hypertension: Secondary | ICD-10-CM | POA: Diagnosis present

## 2017-11-08 DIAGNOSIS — G9341 Metabolic encephalopathy: Secondary | ICD-10-CM | POA: Diagnosis not present

## 2017-11-08 DIAGNOSIS — R4182 Altered mental status, unspecified: Secondary | ICD-10-CM | POA: Diagnosis present

## 2017-11-08 DIAGNOSIS — Z66 Do not resuscitate: Secondary | ICD-10-CM | POA: Diagnosis not present

## 2017-11-08 DIAGNOSIS — Z79899 Other long term (current) drug therapy: Secondary | ICD-10-CM

## 2017-11-08 DIAGNOSIS — E039 Hypothyroidism, unspecified: Secondary | ICD-10-CM | POA: Diagnosis present

## 2017-11-08 DIAGNOSIS — R2681 Unsteadiness on feet: Secondary | ICD-10-CM | POA: Diagnosis not present

## 2017-11-08 DIAGNOSIS — Z8679 Personal history of other diseases of the circulatory system: Secondary | ICD-10-CM | POA: Diagnosis not present

## 2017-11-08 DIAGNOSIS — N390 Urinary tract infection, site not specified: Secondary | ICD-10-CM | POA: Diagnosis present

## 2017-11-08 DIAGNOSIS — Z7982 Long term (current) use of aspirin: Secondary | ICD-10-CM | POA: Diagnosis not present

## 2017-11-08 DIAGNOSIS — F0390 Unspecified dementia without behavioral disturbance: Secondary | ICD-10-CM | POA: Diagnosis not present

## 2017-11-08 DIAGNOSIS — E860 Dehydration: Secondary | ICD-10-CM | POA: Diagnosis not present

## 2017-11-08 DIAGNOSIS — R296 Repeated falls: Secondary | ICD-10-CM | POA: Diagnosis not present

## 2017-11-08 DIAGNOSIS — I160 Hypertensive urgency: Secondary | ICD-10-CM | POA: Diagnosis not present

## 2017-11-08 HISTORY — PX: ENDOVASCULAR REPAIR/STENT GRAFT: CATH118280

## 2017-11-08 LAB — ABO/RH: ABO/RH(D): O POS

## 2017-11-08 LAB — GLUCOSE, CAPILLARY: Glucose-Capillary: 109 mg/dL — ABNORMAL HIGH (ref 65–99)

## 2017-11-08 LAB — MRSA PCR SCREENING: MRSA BY PCR: NEGATIVE

## 2017-11-08 SURGERY — ENDOVASCULAR REPAIR/STENT GRAFT
Anesthesia: General

## 2017-11-08 MED ORDER — MAGNESIUM SULFATE 2 GM/50ML IV SOLN: 2.0000 g | Freq: Every day | INTRAVENOUS | Status: DC | PRN

## 2017-11-08 MED ORDER — HYDRALAZINE HCL 20 MG/ML IJ SOLN: 5.0000 mg | INTRAMUSCULAR | Status: DC | PRN

## 2017-11-08 MED ORDER — SUGAMMADEX SODIUM 200 MG/2ML IV SOLN
INTRAVENOUS | Status: AC
  Filled 2017-11-08: qty 2

## 2017-11-08 MED ORDER — PROPOFOL 10 MG/ML IV BOLUS
INTRAVENOUS | Status: DC | PRN
  Administered 2017-11-08: 100 mg via INTRAVENOUS

## 2017-11-08 MED ORDER — GLYCOPYRROLATE 0.2 MG/ML IJ SOLN
INTRAMUSCULAR | Status: AC
  Filled 2017-11-08: qty 1

## 2017-11-08 MED ORDER — BILBERRY 40 MG PO CAPS: ORAL_CAPSULE | Freq: Every day | ORAL | Status: DC

## 2017-11-08 MED ORDER — LIDOCAINE HCL (PF) 2 % IJ SOLN
INTRAMUSCULAR | Status: AC
  Filled 2017-11-08: qty 10

## 2017-11-08 MED ORDER — POTASSIUM CHLORIDE CRYS ER 20 MEQ PO TBCR: 20.0000 meq | EXTENDED_RELEASE_TABLET | Freq: Every day | ORAL | Status: DC | PRN

## 2017-11-08 MED ORDER — SUGAMMADEX SODIUM 200 MG/2ML IV SOLN
INTRAVENOUS | Status: DC | PRN
  Administered 2017-11-08: 150 mg via INTRAVENOUS

## 2017-11-08 MED ORDER — MORPHINE SULFATE (PF) 2 MG/ML IV SOLN: 2.0000 mg | INTRAVENOUS | Status: DC | PRN

## 2017-11-08 MED ORDER — IOPAMIDOL (ISOVUE-300) INJECTION 61%
INTRAVENOUS | Status: DC | PRN
  Administered 2017-11-08: 50 mL via INTRAVENOUS

## 2017-11-08 MED ORDER — ROCURONIUM BROMIDE 100 MG/10ML IV SOLN
INTRAVENOUS | Status: DC | PRN
  Administered 2017-11-08: 20 mg via INTRAVENOUS

## 2017-11-08 MED ORDER — LABETALOL HCL 5 MG/ML IV SOLN
10.0000 mg | INTRAVENOUS | Status: DC | PRN
  Administered 2017-11-09: 10 mg via INTRAVENOUS
  Filled 2017-11-08: qty 4

## 2017-11-08 MED ORDER — RA EAR DROPS HOMEOPATHIC OT SOLN: Freq: Every day | OTIC | Status: DC | PRN

## 2017-11-08 MED ORDER — FENTANYL CITRATE (PF) 100 MCG/2ML IJ SOLN
INTRAMUSCULAR | Status: AC
  Filled 2017-11-08: qty 2

## 2017-11-08 MED ORDER — ONDANSETRON HCL 4 MG/2ML IJ SOLN
INTRAMUSCULAR | Status: DC | PRN
  Administered 2017-11-08: 4 mg via INTRAVENOUS

## 2017-11-08 MED ORDER — ACETAMINOPHEN 325 MG PO TABS: 325.0000 mg | ORAL_TABLET | Freq: Four times a day (QID) | ORAL | Status: DC | PRN

## 2017-11-08 MED ORDER — MELATONIN 5 MG PO TABS
10.0000 mg | ORAL_TABLET | Freq: Every evening | ORAL | Status: DC | PRN
  Administered 2017-11-08: 10 mg via ORAL
  Filled 2017-11-08 (×2): qty 2

## 2017-11-08 MED ORDER — CALCIUM-VITAMIN D 500-200 MG-UNIT PO TABS
ORAL_TABLET | Freq: Every day | ORAL | Status: DC
  Administered 2017-11-08: 12:00:00 1 via ORAL
  Administered 2017-11-09: 10:00:00 via ORAL
  Filled 2017-11-08 (×2): qty 1

## 2017-11-08 MED ORDER — DOCUSATE SODIUM 100 MG PO CAPS
100.0000 mg | ORAL_CAPSULE | Freq: Every day | ORAL | Status: DC
  Administered 2017-11-09: 100 mg via ORAL
  Filled 2017-11-08: qty 1

## 2017-11-08 MED ORDER — SODIUM CHLORIDE 0.9 % IV SOLN: 500.0000 mL | Freq: Once | INTRAVENOUS | Status: DC | PRN

## 2017-11-08 MED ORDER — EPHEDRINE SULFATE 50 MG/ML IJ SOLN
INTRAMUSCULAR | Status: AC
  Filled 2017-11-08: qty 1

## 2017-11-08 MED ORDER — DEXAMETHASONE SODIUM PHOSPHATE 10 MG/ML IJ SOLN
INTRAMUSCULAR | Status: DC | PRN
  Administered 2017-11-08: 5 mg via INTRAVENOUS

## 2017-11-08 MED ORDER — DOPAMINE-DEXTROSE 3.2-5 MG/ML-% IV SOLN: 3.0000 ug/kg/min | INTRAVENOUS | Status: DC

## 2017-11-08 MED ORDER — FAMOTIDINE IN NACL 20-0.9 MG/50ML-% IV SOLN
20.0000 mg | Freq: Two times a day (BID) | INTRAVENOUS | Status: DC
  Administered 2017-11-08 (×2): 20 mg via INTRAVENOUS
  Filled 2017-11-08 (×2): qty 50

## 2017-11-08 MED ORDER — OXYCODONE HCL 5 MG PO TABS: 5.0000 mg | ORAL_TABLET | Freq: Once | ORAL | Status: DC | PRN

## 2017-11-08 MED ORDER — DEXAMETHASONE SODIUM PHOSPHATE 10 MG/ML IJ SOLN
INTRAMUSCULAR | Status: AC
  Filled 2017-11-08: qty 1

## 2017-11-08 MED ORDER — LORAZEPAM 2 MG/ML IJ SOLN
0.5000 mg | Freq: Once | INTRAMUSCULAR | Status: AC
  Administered 2017-11-08: 0.5 mg via INTRAVENOUS

## 2017-11-08 MED ORDER — FENTANYL CITRATE (PF) 100 MCG/2ML IJ SOLN
INTRAMUSCULAR | Status: DC | PRN
  Administered 2017-11-08 (×4): 25 ug via INTRAVENOUS

## 2017-11-08 MED ORDER — SUCCINYLCHOLINE CHLORIDE 20 MG/ML IJ SOLN
INTRAMUSCULAR | Status: DC | PRN
  Administered 2017-11-08: 80 mg via INTRAVENOUS

## 2017-11-08 MED ORDER — CHLORHEXIDINE GLUCONATE CLOTH 2 % EX PADS: 6.0000 | MEDICATED_PAD | Freq: Once | CUTANEOUS | Status: DC

## 2017-11-08 MED ORDER — LEVOTHYROXINE SODIUM 100 MCG PO TABS
100.0000 ug | ORAL_TABLET | Freq: Every day | ORAL | Status: DC
  Administered 2017-11-09: 100 ug via ORAL
  Filled 2017-11-08: qty 1

## 2017-11-08 MED ORDER — FAMOTIDINE 20 MG PO TABS
ORAL_TABLET | ORAL | Status: AC
  Filled 2017-11-08: qty 1

## 2017-11-08 MED ORDER — FENTANYL CITRATE (PF) 100 MCG/2ML IJ SOLN: 25.0000 ug | INTRAMUSCULAR | Status: DC | PRN

## 2017-11-08 MED ORDER — HALOPERIDOL LACTATE 5 MG/ML IJ SOLN: 2.0000 mg | Freq: Four times a day (QID) | INTRAMUSCULAR | Status: DC | PRN

## 2017-11-08 MED ORDER — EPHEDRINE SULFATE 50 MG/ML IJ SOLN
INTRAMUSCULAR | Status: DC | PRN
  Administered 2017-11-08 (×2): 10 mg via INTRAVENOUS

## 2017-11-08 MED ORDER — NITROGLYCERIN IN D5W 200-5 MCG/ML-% IV SOLN: 5.0000 ug/min | INTRAVENOUS | Status: DC

## 2017-11-08 MED ORDER — OXYCODONE HCL 5 MG/5ML PO SOLN
5.0000 mg | Freq: Once | ORAL | Status: DC | PRN
  Filled 2017-11-08: qty 5

## 2017-11-08 MED ORDER — ONDANSETRON HCL 4 MG/2ML IJ SOLN: 4.0000 mg | Freq: Four times a day (QID) | INTRAMUSCULAR | Status: DC | PRN

## 2017-11-08 MED ORDER — ACETAMINOPHEN 650 MG RE SUPP: 325.0000 mg | RECTAL | Status: DC | PRN

## 2017-11-08 MED ORDER — DIPHENHYDRAMINE HCL 50 MG PO CAPS
50.0000 mg | ORAL_CAPSULE | Freq: Every day | ORAL | Status: DC
  Administered 2017-11-08: 50 mg via ORAL
  Filled 2017-11-08: qty 1

## 2017-11-08 MED ORDER — LIDOCAINE HCL (CARDIAC) 20 MG/ML IV SOLN
INTRAVENOUS | Status: DC | PRN
  Administered 2017-11-08: 50 mg via INTRAVENOUS

## 2017-11-08 MED ORDER — ONDANSETRON HCL 4 MG/2ML IJ SOLN
INTRAMUSCULAR | Status: AC
  Filled 2017-11-08: qty 2

## 2017-11-08 MED ORDER — DEXTROSE 5 % IV SOLN
1.5000 g | Freq: Two times a day (BID) | INTRAVENOUS | Status: AC
  Administered 2017-11-08 (×2): 1.5 g via INTRAVENOUS
  Filled 2017-11-08 (×2): qty 1.5

## 2017-11-08 MED ORDER — PHENOL 1.4 % MT LIQD
1.0000 | OROMUCOSAL | Status: DC | PRN
Start: 2017-11-08 — End: 2017-11-09
  Filled 2017-11-08: qty 177

## 2017-11-08 MED ORDER — PHENYLEPHRINE HCL 10 MG/ML IJ SOLN
INTRAMUSCULAR | Status: AC
  Filled 2017-11-08: qty 1

## 2017-11-08 MED ORDER — LISINOPRIL 5 MG PO TABS
5.0000 mg | ORAL_TABLET | Freq: Every day | ORAL | Status: DC
  Administered 2017-11-08 – 2017-11-09 (×2): 5 mg via ORAL
  Filled 2017-11-08 (×2): qty 1

## 2017-11-08 MED ORDER — VITAMIN C 500 MG PO TABS
500.0000 mg | ORAL_TABLET | Freq: Every day | ORAL | Status: DC
  Administered 2017-11-08 – 2017-11-09 (×2): 500 mg via ORAL
  Filled 2017-11-08 (×2): qty 1

## 2017-11-08 MED ORDER — SUCCINYLCHOLINE CHLORIDE 20 MG/ML IJ SOLN
INTRAMUSCULAR | Status: AC
  Filled 2017-11-08: qty 1

## 2017-11-08 MED ORDER — OXYCODONE-ACETAMINOPHEN 5-325 MG PO TABS: 1.0000 | ORAL_TABLET | ORAL | Status: DC | PRN

## 2017-11-08 MED ORDER — SODIUM CHLORIDE 0.9 % IV SOLN
INTRAVENOUS | Status: DC
  Administered 2017-11-08 – 2017-11-09 (×3): via INTRAVENOUS

## 2017-11-08 MED ORDER — SODIUM CHLORIDE 0.9 % IV SOLN
INTRAVENOUS | Status: DC | PRN
  Administered 2017-11-08: 25 ug/min via INTRAVENOUS

## 2017-11-08 MED ORDER — ACETAMINOPHEN 325 MG PO TABS: 325.0000 mg | ORAL_TABLET | ORAL | Status: DC | PRN

## 2017-11-08 MED ORDER — LORAZEPAM 2 MG/ML IJ SOLN
INTRAMUSCULAR | Status: AC
  Administered 2017-11-08: 0.5 mg via INTRAVENOUS
  Filled 2017-11-08: qty 1

## 2017-11-08 MED ORDER — GLYCOPYRROLATE 0.2 MG/ML IJ SOLN
INTRAMUSCULAR | Status: DC | PRN
  Administered 2017-11-08 (×2): 0.1 mg via INTRAVENOUS

## 2017-11-08 MED ORDER — ROCURONIUM BROMIDE 50 MG/5ML IV SOLN
INTRAVENOUS | Status: AC
  Filled 2017-11-08: qty 1

## 2017-11-08 MED ORDER — METOPROLOL TARTRATE 5 MG/5ML IV SOLN: 2.0000 mg | INTRAVENOUS | Status: DC | PRN

## 2017-11-08 MED ORDER — GUAIFENESIN-DM 100-10 MG/5ML PO SYRP
15.0000 mL | ORAL_SOLUTION | ORAL | Status: DC | PRN
  Filled 2017-11-08: qty 15

## 2017-11-08 MED ORDER — PROPOFOL 10 MG/ML IV BOLUS
INTRAVENOUS | Status: AC
  Filled 2017-11-08: qty 20

## 2017-11-08 MED ORDER — LACTATED RINGERS IV SOLN
INTRAVENOUS | Status: DC
  Administered 2017-11-08: 07:00:00 via INTRAVENOUS

## 2017-11-08 MED ORDER — FAMOTIDINE 20 MG PO TABS
20.0000 mg | ORAL_TABLET | Freq: Once | ORAL | Status: AC
  Administered 2017-11-08: 20 mg via ORAL

## 2017-11-08 MED ORDER — ST JOHNS WORT 150 MG PO CAPS: ORAL_CAPSULE | Freq: Every day | ORAL | Status: DC

## 2017-11-08 MED ORDER — HEPARIN SODIUM (PORCINE) 1000 UNIT/ML IJ SOLN
INTRAMUSCULAR | Status: DC | PRN
  Administered 2017-11-08: 5000 [IU] via INTRAVENOUS

## 2017-11-08 MED ORDER — HEPARIN SODIUM (PORCINE) 1000 UNIT/ML IJ SOLN
INTRAMUSCULAR | Status: AC
  Filled 2017-11-08: qty 1

## 2017-11-08 MED ORDER — ALUM & MAG HYDROXIDE-SIMETH 200-200-20 MG/5ML PO SUSP
15.0000 mL | ORAL | Status: DC | PRN
  Filled 2017-11-08: qty 30

## 2017-11-08 SURGICAL SUPPLY — 44 items
BOOT SUTURE AID YELLOW STND (SUTURE) ×3 IMPLANT
CATH ACCU-VU SIZ PIG 5F 70CM (CATHETERS) ×3 IMPLANT
CATH BALLN CODA 9X100X32 (BALLOONS) ×3 IMPLANT
CATH BEACON 5 .035 65 C2 TIP (CATHETERS) ×3 IMPLANT
CATH BEACON 5 .035 65 KMP TIP (CATHETERS) ×3 IMPLANT
DEVICE CLOSURE PERCLS PRGLD 6F (VASCULAR PRODUCTS) ×4 IMPLANT
DEVICE PRESTO INFLATION (MISCELLANEOUS) ×3 IMPLANT
DEVICE TORQUE (MISCELLANEOUS) ×6 IMPLANT
DRYSEAL FLEXSHEATH 12FR 33CM (SHEATH) ×2
DRYSEAL FLEXSHEATH 16FR 33CM (SHEATH) ×2
ELECT CAUTERY BLADE 6.4 (BLADE) ×3 IMPLANT
ELECT REM PT RETURN 9FT ADLT (ELECTROSURGICAL) ×3
ELECTRODE REM PT RTRN 9FT ADLT (ELECTROSURGICAL) ×1 IMPLANT
EXCLUDER TNK LEG 23MX12X16 (Endovascular Graft) ×1 IMPLANT
EXCLUDER TRUNK LEG 23MX12X16 (Endovascular Graft) ×3 IMPLANT
GLIDEWIRE STIFF .35X180X3 HYDR (WIRE) ×3 IMPLANT
GLOVE BIO SURGEON STRL SZ7 (GLOVE) ×3 IMPLANT
GLOVE BIO SURGEON STRL SZ8 (GLOVE) ×3 IMPLANT
GOWN STRL REUS W/ TWL LRG LVL3 (GOWN DISPOSABLE) ×2 IMPLANT
GOWN STRL REUS W/ TWL XL LVL3 (GOWN DISPOSABLE) ×2 IMPLANT
GOWN STRL REUS W/TWL LRG LVL3 (GOWN DISPOSABLE) ×4
GOWN STRL REUS W/TWL XL LVL3 (GOWN DISPOSABLE) ×4
GRAFT EXCLUDER AORTIC 23X3.3CM (Endovascular Graft) ×3 IMPLANT
GRAFT EXCLUDER LEG 12X14 (Endovascular Graft) ×3 IMPLANT
NEEDLE ENTRY 21GA 7CM ECHOTIP (NEEDLE) ×3 IMPLANT
PACK ANGIOGRAPHY (CUSTOM PROCEDURE TRAY) ×3 IMPLANT
PACK BASIN MAJOR ARMC (MISCELLANEOUS) ×3 IMPLANT
PERCLOSE PROGLIDE 6F (VASCULAR PRODUCTS) ×12
SET INTRO CAPELLA COAXIAL (SET/KITS/TRAYS/PACK) ×3 IMPLANT
SHEATH BRITE TIP 6FRX11 (SHEATH) ×6 IMPLANT
SHEATH BRITE TIP 8FRX11 (SHEATH) ×9 IMPLANT
SHEATH DRYSEAL FLEX 12FR 33CM (SHEATH) ×1 IMPLANT
SHEATH DRYSEAL FLEX 16FR 33CM (SHEATH) ×1 IMPLANT
SHIELD RADPAD SCOOP 12X17 (MISCELLANEOUS) ×3 IMPLANT
SUT MNCRL 4-0 (SUTURE) ×2
SUT MNCRL 4-0 27XMFL (SUTURE) ×1
SUT PROLENE 6 0 BV (SUTURE) ×6 IMPLANT
SUTURE MNCRL 4-0 27XMF (SUTURE) ×1 IMPLANT
SYR 20CC LL (SYRINGE) ×3 IMPLANT
SYR MEDRAD MARK V 150ML (SYRINGE) ×3 IMPLANT
TOWEL OR 17X26 4PK STRL BLUE (TOWEL DISPOSABLE) ×3 IMPLANT
TUBING CONTRAST HIGH PRESS 72 (TUBING) ×3 IMPLANT
WIRE AMPLATZ SSTIFF .035X260CM (WIRE) ×6 IMPLANT
WIRE J 3MM .035X145CM (WIRE) ×6 IMPLANT

## 2017-11-08 NOTE — Anesthesia Post-op Follow-up Note (Signed)
Anesthesia QCDR form completed.        

## 2017-11-08 NOTE — Anesthesia Preprocedure Evaluation (Addendum)
Anesthesia Evaluation  Patient identified by MRN, date of birth, ID band Patient awake    Reviewed: Allergy & Precautions, H&P , NPO status , Patient's Chart, lab work & pertinent test results  Airway Mallampati: III  TM Distance: <3 FB Neck ROM: limited    Dental  (+) Chipped, Poor Dentition   Pulmonary neg shortness of breath, former smoker,           Cardiovascular Exercise Tolerance: Good hypertension, (-) angina+ Peripheral Vascular Disease  (-) Past MI and (-) DOE + Valvular Problems/Murmurs AS      Neuro/Psych negative neurological ROS  negative psych ROS   GI/Hepatic negative GI ROS, Neg liver ROS, neg GERD  ,  Endo/Other  Hypothyroidism   Renal/GU      Musculoskeletal   Abdominal   Peds  Hematology negative hematology ROS (+)   Anesthesia Other Findings Slowed mentation  Past Medical History: 2019: Abdominal aortic aneurysm (AAA) ([facility]) No date: Hypertension No date: Hypothyroidism  Past Surgical History: No date: NO PAST SURGERIES No date: TONSILLECTOMY  BMI    Body Mass Index:  21.59 kg/m      Reproductive/Obstetrics negative OB ROS                           Anesthesia Physical Anesthesia Plan  ASA: III  Anesthesia Plan: General ETT   Post-op Pain Management:    Induction: Intravenous  PONV Risk Score and Plan: Ondansetron, Dexamethasone and Treatment may vary due to age or medical condition  Airway Management Planned: Oral ETT  Additional Equipment:   Intra-op Plan:   Post-operative Plan: Extubation in OR  Informed Consent: I have reviewed the patients History and Physical, chart, labs and discussed the procedure including the risks, benefits and alternatives for the proposed anesthesia with the patient or authorized representative who has indicated his/her understanding and acceptance.   Dental Advisory Given  Plan Discussed with: Anesthesiologist,  CRNA and Surgeon  Anesthesia Plan Comments: (Patient consented for risks of anesthesia including but not limited to:  - adverse reactions to medications - damage to teeth, lips or other oral mucosa - sore throat or hoarseness - Damage to heart, brain, lungs or loss of life  Patient voiced understanding.)        Anesthesia Quick Evaluation

## 2017-11-08 NOTE — Op Note (Signed)
OPERATIVE NOTE   PROCEDURE: 1. US guidance for vascular access, bilateral femoral arteries 2. Catheter placement into aorta from bilateral femoral approaches 3. Placement of a 23 mm proximal, 16 cm length Gore Excluder Endoprosthesis main body right with a 12 mm x 14 cm left contralateral limb 4. Placement of a 23 mm aortic extension cuff 5. ProGlide closure devices bilateral femoral arteries  PRE-OPERATIVE DIAGNOSIS: AAA  POST-OPERATIVE DIAGNOSIS: same  SURGEON: Festus Barren, MD and Levora Dredge, MD - Co-surgeons  ANESTHESIA: general  ESTIMATED BLOOD LOSS: 25 cc  FINDING(S): 1.  AAA  SPECIMEN(S):  none  INDICATIONS:   Debra Bartlett is a 84 y.o. female who presents with a nearly 7 cm AAA. The anatomy was suitable for endovascular repair.  Risks and benefits of repair in an endovascular fashion were discussed and informed consent was obtained.  DESCRIPTION: After obtaining full informed written consent, the patient was brought back to the operating room and placed supine upon the operating table.  The patient received IV antibiotics prior to induction.  After obtaining adequate anesthesia, the patient was prepped and draped in the standard fashion for endovascular AAA repair.  We then began by gaining access to both femoral arteries with US guidance with me working on the right and Dr. Gilda Crease working on the left.  The femoral arteries were found to be patent and accessed without difficulty with a needle under ultrasound guidance without difficulty on each side and permanent images were recorded.  We then placed 2 proglide devices on each side in a pre-close fashion and placed 8 French sheaths. The patient was then given 5000 units of intravenous heparin. The Pigtail catheter was placed into the aorta from the right side. Using this image, we selected a 23 mm diameter by 16 cm length Main body device.  Over a stiff wire, an 16 French sheath was placed up the right side. The main body  was then placed through the 16 French sheath. A Kumpe catheter was placed up the left side and a magnified image at the renal arteries was performed. The main body was then deployed just below the lowest renal artery. The Kumpe catheter was used to cannulate the contralateral gate without difficulty and successful cannulation was confirmed by twirling the pigtail catheter in the main body. We then placed a stiff wire and a retrograde arteriogram was performed through the left femoral sheath. We upsized to the 12 [place] sheath for the contralateral limb and a 12 mm diameter by 14 cm length left iliac limb was selected and deployed. The main body deployment was then completed. . All junction points and seals zones were treated with the compliant balloon. The pigtail catheter was then replaced and a completion angiogram was performed.  A proximal type I endoleak was detected on completion angiography. The renal arteries were found to be widely patent.  The hypogastric arteries were found to be widely patent.  Another attempt with the compliant balloon at the proximal seal zone was tried without success.  We then elected to place a 23 mm diameter aortic extension cuff proximally to remove any pleats and parked this at the absolute base of both renal arteries.  23 mm aortic extension cuff was deployed and then postdilated with a compliant balloon.  At this point, no endoleak was identified on completion imaging brisk flow through the stent graft. At this point we elected to terminate the procedure. We secured the pro glide devices for hemostasis on the femoral  arteries. The skin incision was closed with a 4-0 Monocryl. Dermabond and pressure dressing were placed. The patient was taken to the recovery room in stable condition having tolerated the procedure well.  COMPLICATIONS: none  CONDITION: stable  Festus BarrenJason Baley Shands  11/08/2017, 9:41 AM   This note was created with Dragon Medical transcription system. Any errors in  dictation are purely unintentional.

## 2017-11-08 NOTE — H&P (Signed)
[facility] History & Physical Update  The patient was interviewed and re-examined.  The patient's previous History and Physical has been reviewed and is unchanged.  There is no change in the plan of care. We plan to proceed with the scheduled procedure.  Festus BarrenJason Lorae Roig, MD  11/08/2017, 7:30 AM

## 2017-11-08 NOTE — Anesthesia Postprocedure Evaluation (Signed)
Anesthesia Post Note  Patient: Ballard Russellmma R Rech  Procedure(s) Performed: ENDOVASCULAR REPAIR/STENT GRAFT (N/A )  Patient location during evaluation: PACU Anesthesia Type: General Level of consciousness: awake and alert Pain management: pain level controlled Vital Signs Assessment: post-procedure vital signs reviewed and stable Respiratory status: spontaneous breathing, nonlabored ventilation, respiratory function stable and patient connected to nasal cannula oxygen Cardiovascular status: blood pressure returned to baseline and stable Postop Assessment: no apparent nausea or vomiting Anesthetic complications: no     Last Vitals:  Vitals:   11/08/17 1034 11/08/17 1041  BP: (!) 154/61 136/66  Pulse: 73 72  Resp: 14 18  Temp: 36.4 C   SpO2: 93% 96%    Last Pain:  Vitals:   11/08/17 0646  TempSrc: Oral                 Cleda MccreedyJoseph K Piscitello

## 2017-11-08 NOTE — Progress Notes (Signed)
PHARMACIST - PHYSICIAN ORDER COMMUNICATION  CONCERNING: P&T Medication Policy on Herbal Medications  DESCRIPTION:  This patient's orders for:  RA HOMEOPATHIC EAR DROPS, BILBERRY, and [place] JOHNS WORT Capsule  has been noted.  These product(s) is classified as an "herbal" or natural product. Due to a lack of definitive safety studies or FDA approval, nonstandard manufacturing practices, plus the potential risk of unknown drug-drug interactions while on inpatient medications, the Pharmacy and Therapeutics Committee does not permit the use of "herbal" or natural products of this type within [facility].   ACTION TAKEN: The pharmacy department is unable to verify this order at this time. Please reevaluate patient's clinical condition at discharge and address if the herbal or natural product(s) should be resumed at that time.  Gardner CandleSheema M Adelie Croswell, PharmD, BCPS Clinical Pharmacist 11/08/2017 11:03 AM

## 2017-11-08 NOTE — Transfer of Care (Signed)
Immediate Anesthesia Transfer of Care Note  Patient: Debra Bartlett  Procedure(s) Performed: ENDOVASCULAR REPAIR/STENT GRAFT (N/A )  Patient Location: PACU  Anesthesia Type:General  Level of Consciousness: awake  Airway & Oxygen Therapy: Patient Spontanous Breathing and Patient connected to face mask oxygen  Post-op Assessment: Report given to RN and Post -op Vital signs reviewed and stable  Post vital signs: Reviewed  Last Vitals:  Vitals:   11/08/17 0646 11/08/17 0945  BP: (!) 158/68 (!) 151/83  Pulse: (!) 57 84  Resp:  16  Temp: 36.4 C 36.6 C  SpO2:  100%    Last Pain:  Vitals:   11/08/17 0646  TempSrc: Oral         Complications: No apparent anesthesia complications

## 2017-11-08 NOTE — Op Note (Signed)
OPERATIVE NOTE   PROCEDURE: 1. US guidance for vascular access, bilateral femoral arteries 2. Catheter placement into aorta from bilateral femoral approaches 3. Placement of a 23x12x16 C3 Gore Excluder Endoprosthesis main body with a 12x14 contralateral limb 4. Placement of a 23 x 3 aortic cuff 5. ProGlide closure devices bilateral femoral arteries  PRE-OPERATIVE DIAGNOSIS: AAA  POST-OPERATIVE DIAGNOSIS: same  SURGEON: Levora Dredge, MD and Festus Barren, MD - Co-surgeons  ANESTHESIA: general  ESTIMATED BLOOD LOSS: 25 cc  FINDING(S): 1.  AAA  SPECIMEN(S):  none  INDICATIONS:   Debra Bartlett is a 84 y.o. y.o. female who presents with a 7 cm abdominal aortic aneurysm.  She was seen several times in the emergency room complaining of back and abdominal pain.  Ultimately CT scan demonstrated this large aneurysm.  The anatomy is acceptable for stent graft repair.  Risks and benefits were reviewed all questions were answered patient has agreed to proceed.  DESCRIPTION: After obtaining full informed written consent, the patient was brought back to the operating room and placed supine upon the operating table.  The patient received IV antibiotics prior to induction.  After obtaining adequate anesthesia, the patient was prepped and draped in the standard fashion for endovascular AAA repair.  We then began by gaining access to both femoral arteries with US guidance with me working on the left and Dr. Wyn Quaker working on the right.  The femoral arteries were found to be patent and accessed without difficulty with a needle under ultrasound guidance without difficulty on each side and permanent images were recorded.  We then placed 2 proglide devices on each side in a pre-close fashion and placed 8 French sheaths.  The patient was then given 5000 units of intravenous heparin.   The Pigtail catheter was placed into the aorta from the right side. Using this image, we selected a 23 x 12 x 16 Main body  device.  Over a stiff wire, an 28 French sheath was placed. The main body was then placed through the 18 French sheath. A Kumpe catheter was placed up the left side and a magnified image at the renal arteries was performed. The main body was then deployed just below the lowest renal artery. The Kumpe catheter was used to cannulate the contralateral gate without difficulty and successful cannulation was confirmed by twirling the pigtail catheter in the main body. We then placed a stiff wire and a retrograde arteriogram was performed through the left 12 Fr femoral sheath. We upsized to the 12 [place] sheath for the contralateral limb and a 12 x 14 limb was selected and deployed. The main body deployment was then completed. Based off the angiographic findings, extension limbs were not necessary.  All junction points and seals zones were treated with the compliant balloon.   The pigtail catheter was then replaced and a completion angiogram was performed.   Type I endoleak was detected on completion angiography.  Therefore, the proximal portion of the graft was re-angioplastied with a coated balloon.  The leak did not resolve and therefore a 23 x 3 aortic extender cuff was deployed postdilated with a coated balloon.  Follow-up imaging demonstrated complete resolution of the leak.  The renal arteries were found to be widely patent.  At the conclusion there are no endo-leaks identified.   At this point we elected to terminate the procedure. We secured the pro glide devices for hemostasis on the femoral arteries. The skin incision was closed with a 4-0 Monocryl.  Dermabond and pressure dressing were placed. The patient was taken to the recovery room in stable condition having tolerated the procedure well.  COMPLICATIONS: none  CONDITION: stable  Levora DredgeGregory Larnell Granlund  11/08/2017, 9:48 AM

## 2017-11-08 NOTE — Anesthesia Procedure Notes (Signed)
Procedure Name: Intubation Performed by: Lakoda Mcanany, CRNA Pre-anesthesia Checklist: Patient identified, Patient being monitored, Timeout performed, Emergency Drugs available and Suction available Patient Re-evaluated:Patient Re-evaluated prior to induction Oxygen Delivery Method: Circle system utilized Preoxygenation: Pre-oxygenation with 100% oxygen Induction Type: IV induction Ventilation: Mask ventilation without difficulty Laryngoscope Size: Miller and 2 Grade View: Grade I Tube type: Oral Tube size: 7.0 mm Number of attempts: 1 Airway Equipment and Method: Stylet Placement Confirmation: ETT inserted through vocal cords under direct vision,  positive ETCO2 and breath sounds checked- equal and bilateral Secured at: 21 cm Tube secured with: Tape Dental Injury: Teeth and Oropharynx as per pre-operative assessment        

## 2017-11-08 NOTE — OR Nursing (Signed)
12 lead done due to possible elevation of T-Wave. Anesthesia to review and compare to prior EKG

## 2017-11-08 NOTE — Progress Notes (Signed)
[facility]  Daily Progress Note   Subjective  - Day of Surgery  Patient sitting up in bed.  Minimal discomfort.  Overall doing quite well.  Objective Vitals:   11/08/17 1048 11/08/17 1130 11/08/17 1133 11/08/17 1200  BP:  (!) 162/62 (!) 162/62 (!) 157/84  Pulse:  69  70  Resp: 17 12  17   Temp: (!) 97.3 F (36.3 C)     TempSrc: Axillary     SpO2: 96% 94%  96%  Weight:      Height:        Intake/Output Summary (Last 24 hours) at 11/08/2017 1509 Last data filed at 11/08/2017 1031 Gross per 24 hour  Intake 525 ml  Output 350 ml  Net 175 ml    PULM  CTAB CV  RRR VASC  feet warm with good capillary refill, access sites are clean, dry, and intact  Laboratory CBC    Component Value Date/Time   WBC 6.9 10/31/2017 0811   HGB 13.1 10/31/2017 0811   HCT 40.5 10/31/2017 0811   PLT 88 (L) 10/31/2017 0811    BMET    Component Value Date/Time   NA 137 10/31/2017 0811   K 3.8 10/31/2017 0811   CL 102 10/31/2017 0811   CO2 29 10/31/2017 0811   GLUCOSE 92 10/31/2017 0811   BUN 16 10/31/2017 0811   CREATININE 0.71 10/31/2017 0811   CALCIUM 9.2 10/31/2017 0811   GFRNONAA >60 10/31/2017 0811   GFRAA >60 10/31/2017 0811    Assessment/Planning: POD #0 s/p endovascular aneurysm repair   Advance diet as tolerated  Leave PAD's on until tomorrow morning.  Increase activity  Remove Foley catheter in the morning.  Likely discharge tomorrow    Festus BarrenJason Dabid Godown  11/08/2017, 3:09 PM

## 2017-11-09 ENCOUNTER — Emergency Department: Payer: Medicare HMO

## 2017-11-09 ENCOUNTER — Encounter: Payer: Self-pay | Admitting: Emergency Medicine

## 2017-11-09 ENCOUNTER — Other Ambulatory Visit: Payer: Self-pay

## 2017-11-09 ENCOUNTER — Observation Stay
Admission: EM | Admit: 2017-11-09 | Discharge: 2017-11-12 | Disposition: A | Discharge status: Home Health | Payer: Medicare HMO | Attending: Internal Medicine | Admitting: Internal Medicine

## 2017-11-09 DIAGNOSIS — N3000 Acute cystitis without hematuria: Secondary | ICD-10-CM | POA: Insufficient documentation

## 2017-11-09 DIAGNOSIS — I160 Hypertensive urgency: Secondary | ICD-10-CM | POA: Insufficient documentation

## 2017-11-09 DIAGNOSIS — R296 Repeated falls: Secondary | ICD-10-CM | POA: Insufficient documentation

## 2017-11-09 DIAGNOSIS — N390 Urinary tract infection, site not specified: Secondary | ICD-10-CM

## 2017-11-09 DIAGNOSIS — Z87891 Personal history of nicotine dependence: Secondary | ICD-10-CM | POA: Insufficient documentation

## 2017-11-09 DIAGNOSIS — I714 Abdominal aortic aneurysm, without rupture: Secondary | ICD-10-CM | POA: Insufficient documentation

## 2017-11-09 DIAGNOSIS — F0390 Unspecified dementia without behavioral disturbance: Secondary | ICD-10-CM | POA: Insufficient documentation

## 2017-11-09 DIAGNOSIS — E860 Dehydration: Secondary | ICD-10-CM | POA: Insufficient documentation

## 2017-11-09 DIAGNOSIS — Z66 Do not resuscitate: Secondary | ICD-10-CM | POA: Insufficient documentation

## 2017-11-09 DIAGNOSIS — R2681 Unsteadiness on feet: Secondary | ICD-10-CM | POA: Insufficient documentation

## 2017-11-09 DIAGNOSIS — G9341 Metabolic encephalopathy: Secondary | ICD-10-CM | POA: Diagnosis not present

## 2017-11-09 DIAGNOSIS — Z7982 Long term (current) use of aspirin: Secondary | ICD-10-CM | POA: Insufficient documentation

## 2017-11-09 DIAGNOSIS — R4182 Altered mental status, unspecified: Secondary | ICD-10-CM

## 2017-11-09 DIAGNOSIS — I10 Essential (primary) hypertension: Secondary | ICD-10-CM | POA: Insufficient documentation

## 2017-11-09 DIAGNOSIS — E039 Hypothyroidism, unspecified: Secondary | ICD-10-CM | POA: Insufficient documentation

## 2017-11-09 DIAGNOSIS — Z8679 Personal history of other diseases of the circulatory system: Secondary | ICD-10-CM | POA: Insufficient documentation

## 2017-11-09 LAB — BPAM RBC
BLOOD PRODUCT EXPIRATION DATE: 201903112359
Blood Product Expiration Date: 201903112359
UNIT TYPE AND RH: 5100
Unit Type and Rh: 5100

## 2017-11-09 LAB — BASIC METABOLIC PANEL
Anion gap: 9 (ref 5–15)
BUN: 18 mg/dL (ref 6–20)
CHLORIDE: 106 mmol/L (ref 101–111)
CO2: 26 mmol/L (ref 22–32)
Calcium: 8.9 mg/dL (ref 8.9–10.3)
Creatinine, Ser: 0.96 mg/dL (ref 0.44–1.00)
GFR calc Af Amer: 59 mL/min — ABNORMAL LOW (ref >60)
GFR calc non Af Amer: 51 mL/min — ABNORMAL LOW (ref >60)
GLUCOSE: 133 mg/dL — AB (ref 65–99)
POTASSIUM: 3.6 mmol/L (ref 3.5–5.1)
Sodium: 141 mmol/L (ref 135–145)

## 2017-11-09 LAB — CBC
HCT: 35.9 % (ref 35.0–47.0)
HEMATOCRIT: 37 % (ref 35.0–47.0)
HEMOGLOBIN: 12.2 g/dL (ref 12.0–16.0)
Hemoglobin: 11.8 g/dL — ABNORMAL LOW (ref 12.0–16.0)
MCH: 30.8 pg (ref 26.0–34.0)
MCH: 30.9 pg (ref 26.0–34.0)
MCHC: 32.9 g/dL (ref 32.0–36.0)
MCHC: 33 g/dL (ref 32.0–36.0)
MCV: 93.6 fL (ref 80.0–100.0)
MCV: 93.6 fL (ref 80.0–100.0)
Platelets: 107 10*3/uL — ABNORMAL LOW (ref 150–440)
Platelets: 160 10*3/uL (ref 150–440)
RBC: 3.84 MIL/uL (ref 3.80–5.20)
RBC: 3.96 MIL/uL (ref 3.80–5.20)
RDW: 15.3 % — ABNORMAL HIGH (ref 11.5–14.5)
RDW: 15.3 % — ABNORMAL HIGH (ref 11.5–14.5)
WBC: 11.7 10*3/uL — ABNORMAL HIGH (ref 3.6–11.0)
WBC: 11.2 10*3/uL — ABNORMAL HIGH (ref 3.6–11.0)

## 2017-11-09 LAB — TYPE AND SCREEN
ABO/RH(D): O POS
ANTIBODY SCREEN: NEGATIVE
UNIT DIVISION: 0
Unit division: 0

## 2017-11-09 LAB — COMPREHENSIVE METABOLIC PANEL
ALBUMIN: 3.8 g/dL (ref 3.5–5.0)
ALT: 18 U/L (ref 14–54)
ANION GAP: 8 (ref 5–15)
AST: 44 U/L — ABNORMAL HIGH (ref 15–41)
Alkaline Phosphatase: 61 U/L (ref 38–126)
BUN: 21 mg/dL — ABNORMAL HIGH (ref 6–20)
CO2: 26 mmol/L (ref 22–32)
Calcium: 9.6 mg/dL (ref 8.9–10.3)
Chloride: 104 mmol/L (ref 101–111)
Creatinine, Ser: 1.14 mg/dL — ABNORMAL HIGH (ref 0.44–1.00)
GFR calc Af Amer: 48 mL/min — ABNORMAL LOW (ref >60)
GFR calc non Af Amer: 41 mL/min — ABNORMAL LOW (ref >60)
GLUCOSE: 125 mg/dL — AB (ref 65–99)
POTASSIUM: 3.9 mmol/L (ref 3.5–5.1)
SODIUM: 138 mmol/L (ref 135–145)
Total Bilirubin: 0.7 mg/dL (ref 0.3–1.2)
Total Protein: 6.9 g/dL (ref 6.5–8.1)

## 2017-11-09 LAB — URINALYSIS, COMPLETE (UACMP) WITH MICROSCOPIC
BACTERIA UA: NONE SEEN
Bilirubin Urine: NEGATIVE
Glucose, UA: NEGATIVE mg/dL
KETONES UR: NEGATIVE mg/dL
NITRITE: NEGATIVE
PROTEIN: 30 mg/dL — AB
Specific Gravity, Urine: 1.02 (ref 1.005–1.030)
pH: 5 (ref 5.0–8.0)

## 2017-11-09 LAB — PREPARE RBC (CROSSMATCH)

## 2017-11-09 MED ORDER — ACETAMINOPHEN 650 MG RE SUPP: 650.0000 mg | Freq: Four times a day (QID) | RECTAL | Status: DC | PRN

## 2017-11-09 MED ORDER — HYDRALAZINE HCL 20 MG/ML IJ SOLN
10.0000 mg | Freq: Four times a day (QID) | INTRAMUSCULAR | Status: DC | PRN
  Administered 2017-11-09: 10 mg via INTRAVENOUS
  Filled 2017-11-09: qty 1

## 2017-11-09 MED ORDER — ENOXAPARIN SODIUM 40 MG/0.4ML ~~LOC~~ SOLN
40.0000 mg | SUBCUTANEOUS | Status: DC
  Administered 2017-11-09 – 2017-11-11 (×3): 40 mg via SUBCUTANEOUS
  Filled 2017-11-09 (×3): qty 0.4

## 2017-11-09 MED ORDER — DIPHENHYDRAMINE HCL 25 MG PO CAPS
50.0000 mg | ORAL_CAPSULE | Freq: Every day | ORAL | Status: DC
Start: 2017-11-09 — End: 2017-11-12
  Administered 2017-11-09 – 2017-11-11 (×3): 50 mg via ORAL
  Filled 2017-11-09 (×3): qty 2

## 2017-11-09 MED ORDER — LISINOPRIL 5 MG PO TABS
5.0000 mg | ORAL_TABLET | Freq: Every day | ORAL | Status: DC
  Administered 2017-11-10 – 2017-11-11 (×2): 5 mg via ORAL
  Filled 2017-11-09 (×2): qty 1

## 2017-11-09 MED ORDER — BISACODYL 5 MG PO TBEC
5.0000 mg | DELAYED_RELEASE_TABLET | Freq: Every day | ORAL | Status: DC | PRN
  Administered 2017-11-11: 5 mg via ORAL
  Filled 2017-11-09: qty 1

## 2017-11-09 MED ORDER — SODIUM CHLORIDE 0.9 % IV BOLUS (SEPSIS)
500.0000 mL | Freq: Once | INTRAVENOUS | Status: AC
  Administered 2017-11-09: 500 mL via INTRAVENOUS

## 2017-11-09 MED ORDER — ONDANSETRON HCL 4 MG PO TABS: 4.0000 mg | ORAL_TABLET | Freq: Four times a day (QID) | ORAL | Status: DC | PRN

## 2017-11-09 MED ORDER — DEXTROSE 5 % IV SOLN
1.0000 g | Freq: Once | INTRAVENOUS | Status: AC
  Administered 2017-11-09: 1 g via INTRAVENOUS
  Filled 2017-11-09: qty 10

## 2017-11-09 MED ORDER — ONDANSETRON HCL 4 MG/2ML IJ SOLN: 4.0000 mg | Freq: Four times a day (QID) | INTRAMUSCULAR | Status: DC | PRN

## 2017-11-09 MED ORDER — ACETAMINOPHEN 325 MG PO TABS: 650.0000 mg | ORAL_TABLET | Freq: Four times a day (QID) | ORAL | Status: DC | PRN

## 2017-11-09 MED ORDER — HYDRALAZINE HCL 20 MG/ML IJ SOLN
INTRAMUSCULAR | Status: AC
  Filled 2017-11-09: qty 1

## 2017-11-09 MED ORDER — MELATONIN 5 MG PO TABS
10.0000 mg | ORAL_TABLET | Freq: Every evening | ORAL | Status: DC | PRN
  Administered 2017-11-11: 21:00:00 10 mg via ORAL
  Filled 2017-11-09 (×2): qty 2

## 2017-11-09 MED ORDER — LEVOTHYROXINE SODIUM 100 MCG PO TABS
100.0000 ug | ORAL_TABLET | Freq: Every day | ORAL | Status: DC
  Administered 2017-11-10 – 2017-11-12 (×3): 100 ug via ORAL
  Filled 2017-11-09 (×3): qty 1

## 2017-11-09 MED ORDER — DEXTROSE 5 % IV SOLN
1.0000 g | INTRAVENOUS | Status: DC
  Administered 2017-11-10 – 2017-11-11 (×2): 1 g via INTRAVENOUS
  Filled 2017-11-09 (×4): qty 10

## 2017-11-09 MED ORDER — HYDROCODONE-ACETAMINOPHEN 5-325 MG PO TABS: 1.0000 | ORAL_TABLET | ORAL | Status: DC | PRN

## 2017-11-09 MED ORDER — SODIUM CHLORIDE 0.9 % IV SOLN
INTRAVENOUS | Status: DC
  Administered 2017-11-09 – 2017-11-10 (×2): via INTRAVENOUS

## 2017-11-09 MED ORDER — ASPIRIN EC 81 MG PO TBEC: 81.0000 mg | DELAYED_RELEASE_TABLET | Freq: Every day | ORAL | 2 refills | Status: AC

## 2017-11-09 MED ORDER — ASPIRIN EC 81 MG PO TBEC
81.0000 mg | DELAYED_RELEASE_TABLET | Freq: Every day | ORAL | Status: DC
  Administered 2017-11-10 – 2017-11-12 (×3): 81 mg via ORAL
  Filled 2017-11-09 (×3): qty 1

## 2017-11-09 MED ORDER — SENNOSIDES-DOCUSATE SODIUM 8.6-50 MG PO TABS
1.0000 | ORAL_TABLET | Freq: Every evening | ORAL | Status: DC | PRN
  Administered 2017-11-11: 21:00:00 1 via ORAL
  Filled 2017-11-09: qty 1

## 2017-11-09 MED ORDER — ALBUTEROL SULFATE (2.5 MG/3ML) 0.083% IN NEBU: 2.5000 mg | INHALATION_SOLUTION | RESPIRATORY_TRACT | Status: DC | PRN

## 2017-11-09 NOTE — Discharge Summary (Signed)
[facility]    Discharge Summary    Patient ID:  Debra Bartlett MRN: [id number] DOB/AGE: 82/02/1925 82 y.o.  Admit date: 11/08/2017 Discharge date: 11/09/2017 Date of Surgery: 11/08/2017 Surgeon: Surgeon(s): Charnae Lill, Marlow BaarsJason S, MD Schnier, Latina CraverGregory G, MD  Admission Diagnosis: AAA (abdominal aortic aneurysm) without rupture [facility](HCC) [I71.4]  Discharge Diagnoses:  AAA (abdominal aortic aneurysm) without rupture [facility](HCC) [I71.4]  Secondary Diagnoses: Past Medical History:  Diagnosis Date  . Abdominal aortic aneurysm (AAA) (HCC) 2019  . Hypertension   . Hypothyroidism     Procedure(s): ENDOVASCULAR REPAIR/STENT GRAFT  Discharged Condition: good  HPI:  Patient with nearly 7 cm AAA, here for elective repair  Hospital Course:  Debra Bartlett is a 84 y.o. female is S/P Neither Procedure(s): ENDOVASCULAR REPAIR/STENT GRAFT Extubated: POD # 0 Physical exam: access sites C/D/I.  Feet warm Post-op wounds clean, dry, intact or healing well Pt. Ambulating, voiding and taking PO diet without difficulty. Pt pain controlled with PO pain meds. Labs as below Complications:none  Consults:    Significant Diagnostic Studies: CBC Lab Results  Component Value Date   WBC 11.7 (H) 11/09/2017   HGB 11.8 (L) 11/09/2017   HCT 35.9 11/09/2017   MCV 93.6 11/09/2017   PLT 107 (L) 11/09/2017    BMET    Component Value Date/Time   NA 141 11/09/2017 0501   K 3.6 11/09/2017 0501   CL 106 11/09/2017 0501   CO2 26 11/09/2017 0501   GLUCOSE 133 (H) 11/09/2017 0501   BUN 18 11/09/2017 0501   CREATININE 0.96 11/09/2017 0501   CALCIUM 8.9 11/09/2017 0501   GFRNONAA 51 (L) 11/09/2017 0501   GFRAA 59 (L) 11/09/2017 0501   COAG Lab Results  Component Value Date   INR 0.99 10/31/2017     Disposition:  Discharge to :Home Discharge Instructions    Call MD for:  redness, tenderness, or signs of infection (pain, swelling, bleeding, redness, odor or green/yellow discharge around  incision site)   Complete by:  As directed    Call MD for:  severe or increased pain, loss or decreased feeling  in affected limb(s)   Complete by:  As directed    Call MD for:  temperature >100.5   Complete by:  As directed    Driving Restrictions   Complete by:  As directed    No driving for 48   Lifting restrictions   Complete by:  As directed    No lifting for 48 hours   No dressing needed   Complete by:  As directed    Replace only if drainage present   Resume previous diet   Complete by:  As directed      Allergies as of 11/09/2017   No Known Allergies     Medication List    TAKE these medications   acetaminophen 325 MG tablet Commonly known as:  TYLENOL Take 325 mg by mouth every 6 (six) hours as needed for moderate pain or headache.   aspirin EC 81 MG tablet Take 1 tablet (81 mg total) by mouth daily.   BILBERRY PO Take 1 tablet by mouth daily.   CALCIUM-VITAMIN D3 PO Take 1 tablet by mouth daily.   diphenhydrAMINE 25 mg capsule Commonly known as:  BENADRYL Take 50 mg by mouth at bedtime.   levothyroxine 100 MCG tablet Commonly known as:  SYNTHROID, LEVOTHROID Take 100 mcg by mouth daily.   lisinopril 5 MG tablet Commonly known as:  PRINIVIL,ZESTRIL Take  5 mg by mouth daily.   Melatonin 10 MG Caps Take 10 mg by mouth at bedtime as needed (for sleep).   RA EAR DROPS HOMEOPATHIC OT Place 2 drops into both ears daily as needed (for stopped up ear).   ST JOHNS WORT PO Take 1 tablet by mouth daily.   VITAMIN B-12 PO Take 1 tablet by mouth daily.   VITAMIN C PO Take 1 tablet by mouth daily.   VITAMIN D3 PO Take 1 capsule by mouth daily.   VITAMIN E PO Take 1 capsule by mouth daily.      Verbal and written Discharge instructions given to the patient. Wound care per Discharge AVS Follow-up Information    Stegmayer, Cala Bradford A, PA-C Follow up in 4 week(s).   Specialty:  Physician Assistant Why:  with [facility] duplex Contact information: [id number] [place] [address] [phone]           Signed: Festus Barren, MD  11/09/2017, 8:44 AM

## 2017-11-09 NOTE — ED Notes (Signed)
Pt is resting at this time and stated she was going to go ahead and finish what she was doing.

## 2017-11-09 NOTE — ED Notes (Signed)
Pt wet herself. This tech cleaned pt and placed pt belongings in two bags (one has wet clothes). Pt placed in diaper and hospital gown and yellow socks. Linens changed on bed and pt given clean warm blankets. Pt is sitting up in bed at this time talking. Pt wandering who all these people are in the hall and where [place] [employer] and Chanetta MarshallJimmy are. Pt does not realize she is in the hospital even after being told repeatedly. Pt is pleasant.

## 2017-11-09 NOTE — Progress Notes (Signed)
Pharmacy Antibiotic Note  Debra Bartlett is a 84 y.o. female admitted on 11/09/2017 with UTI.  Pharmacy has been consulted for ceftriaxone dosing.  Plan: ceftriaxone 1gm iv q24h  Height: 5\' 6"  (167.6 cm) Weight: 144 lb (65.3 kg) IBW/kg (Calculated) : 59.3  Temp (24hrs), Avg:98.1 F (36.7 C), Min:97.7 F (36.5 C), Max:98.7 F (37.1 C)  Recent Labs  Lab 11/09/17 0501 11/09/17 1233  WBC 11.7* 11.2*  CREATININE 0.96 1.14*    Estimated Creatinine Clearance: 30.7 mL/min (A) (by C-G formula based on SCr of 1.14 mg/dL (H)).    No Known Allergies  Antimicrobials this admission: Anti-infectives (From admission, onward)   Start     Dose/Rate Route Frequency Ordered Stop   11/10/17 1500  cefTRIAXone (ROCEPHIN) 1 g in dextrose 5 % 50 mL IVPB     1 g 100 mL/hr over 30 Minutes Intravenous Every 24 hours 11/09/17 1524     11/09/17 1445  cefTRIAXone (ROCEPHIN) 1 g in dextrose 5 % 50 mL IVPB     1 g 100 mL/hr over 30 Minutes Intravenous  Once 11/09/17 1436         Microbiology results: Recent Results (from the past 240 hour(s))  Surgical pcr screen     Status: None   Collection Time: 10/31/17  8:19 AM  Result Value Ref Range Status   MRSA, PCR NEGATIVE NEGATIVE Final   Staphylococcus aureus NEGATIVE NEGATIVE Final    Comment: (NOTE) The Xpert SA Assay (FDA approved for NASAL specimens in patients 82 years of age and older), is one component of a comprehensive surveillance program. It is not intended to diagnose infection nor to guide or monitor treatment. Performed at [facility], [address] [place], [place] [address]   MRSA PCR Screening     Status: None   Collection Time: 11/08/17 11:00 AM  Result Value Ref Range Status   MRSA by PCR NEGATIVE NEGATIVE Final    Comment:        The GeneXpert MRSA Assay (FDA approved for NASAL specimens only), is one component of a comprehensive MRSA colonization surveillance program. It is not intended to diagnose  MRSA infection nor to guide or monitor treatment for MRSA infections. Performed at [facility], [address] [place], [place] [address]     Thank you for allowing pharmacy to be a part of this patient's care.  Gerre PebblesGarrett Jadiel Schmieder 11/09/2017 3:24 PM

## 2017-11-09 NOTE — ED Notes (Signed)
Debra Bartlett, EDT at bedside to sit with patient for safety reasons. Pt found to be standing at end of bed pulling monitor off stating she was going home. Pt is noted to be confused, is oriented to person only.

## 2017-11-09 NOTE — ED Notes (Signed)
Pt condition discussed with Dr. Roxan Hockeyobinson, see new order.

## 2017-11-09 NOTE — ED Provider Notes (Signed)
[facility] Emergency Department Provider Note  ____________________________________________   I have reviewed the triage vital signs and the nursing notes.   HISTORY  Chief Complaint Altered Mental Status   History limited by and level 5 caveat due to dementia and confusion.   HPI Debra Bartlett is a 84 y.o. female who presents to the emergency department today brought in by neighbor because of concerns for confusion.  The patient has a history of some confusion.  I spoke to the daughter-in-law over the phone.  It sounds like for the past couple days however she has been more confused.  She was discharged from the hospital earlier today after an elective AAA repair by vascular surgery. Per medical chart she was disoriented during her stay.  The patient herself cannot give any history as to what brought her to the emergency department today.  Per medical record review patient has a history of HTN, AAA stent.   Past Medical History:  Diagnosis Date  . Abdominal aortic aneurysm (AAA) (HCC) 2019  . Hypertension   . Hypothyroidism     Patient Active Problem List   Diagnosis Date Noted  . Hypertension 09/19/2017  . AAA (abdominal aortic aneurysm) without rupture (HCC) 09/19/2017    Past Surgical History:  Procedure Laterality Date  . ENDOVASCULAR REPAIR/STENT GRAFT N/A 11/08/2017   Procedure: ENDOVASCULAR REPAIR/STENT GRAFT;  Surgeon: Annice Needyew, Jason S, MD;  Location: [facility] INVASIVE CV LAB;  Service: Cardiovascular;  Laterality: N/A;  . NO PAST SURGERIES    . TONSILLECTOMY      Prior to Admission medications   Medication Sig Start Date End Date Taking? Authorizing Provider  acetaminophen (TYLENOL) 325 MG tablet Take 325 mg by mouth every 6 (six) hours as needed for moderate pain or headache.     [provider]  Ascorbic Acid (VITAMIN C PO) Take 1 tablet by mouth daily.    [provider]  aspirin EC 81 MG tablet Take 1 tablet (81 mg total) by  mouth daily. 11/09/17   Annice Needyew, Jason S, MD  Bilberry, Vaccinium myrtillus, (BILBERRY PO) Take 1 tablet by mouth daily.    [provider]  Calcium Carbonate-Vitamin D (CALCIUM-VITAMIN D3 PO) Take 1 tablet by mouth daily.    [provider]  Cholecalciferol (VITAMIN D3 PO) Take 1 capsule by mouth daily.    [provider]  Cyanocobalamin (VITAMIN B-12 PO) Take 1 tablet by mouth daily.    [provider]  diphenhydrAMINE (BENADRYL) 25 mg capsule Take 50 mg by mouth at bedtime.    [provider]  Homeopathic Products (RA EAR DROPS HOMEOPATHIC OT) Place 2 drops into both ears daily as needed (for stopped up ear).    [provider]  levothyroxine (SYNTHROID, LEVOTHROID) 100 MCG tablet Take 100 mcg by mouth daily.  05/12/17   [provider]  lisinopril (PRINIVIL,ZESTRIL) 5 MG tablet Take 5 mg by mouth daily.    [provider]  Melatonin 10 MG CAPS Take 10 mg by mouth at bedtime as needed (for sleep).    [provider]  ST JOHNS WORT PO Take 1 tablet by mouth daily.    [provider]  VITAMIN E PO Take 1 capsule by mouth daily.    [provider]    Allergies Patient has no known allergies.  Family History  Family history unknown: Yes    Social History Social History   Tobacco Use  . Smoking status: Former Smoker  Types: Cigarettes  . Smokeless tobacco: Never Used  Substance Use Topics  . Alcohol use: No    Frequency: Never  . Drug use: No    Review of Systems Unable to obtain reliable ROS given AMS.  ____________________________________________   PHYSICAL EXAM:  VITAL SIGNS: ED Triage Vitals  Enc Vitals Group     BP 11/09/17 1225 137/83     Pulse Rate 11/09/17 1225 68     Resp --      Temp 11/09/17 1225 98.7 F (37.1 C)     Temp Source 11/09/17 1225 Oral     SpO2 11/09/17 1225 98 %     Weight 11/09/17 1226 144 lb (65.3 kg)     Height 11/09/17 1226 5\' 6"  (1.676 m)      Head Circumference --      Peak Flow --      Pain Score 11/09/17 1225 0   Constitutional: Awake and alert. Not oriented to events, time.  Eyes: Conjunctivae are normal.  ENT   Head: Normocephalic and atraumatic.   Nose: No congestion/rhinnorhea.   Mouth/Throat: Mucous membranes are moist.   Neck: No stridor. Hematological/Lymphatic/Immunilogical: No cervical lymphadenopathy. Cardiovascular: Normal rate, regular rhythm.  No murmurs, rubs, or gallops.  Respiratory: Normal respiratory effort without tachypnea nor retractions. Breath sounds are clear and equal bilaterally. No wheezes/rales/rhonchi. Gastrointestinal: Soft and non tender. No rebound. No guarding.  Genitourinary: Deferred Musculoskeletal: Normal range of motion in all extremities. No lower extremity edema. Neurologic:  Disoriented.  No gross focal neurologic deficits are appreciated.  Skin:  Skin is warm, dry and intact. No rash noted.  ____________________________________________    LABS (pertinent positives/negatives)  UA 6-30 wbcs, too numerous to count rbc, trace leukocytes CBC wbc 11.2, hgb 12.2, plt 160 CMP cr 1.14  ____________________________________________   EKG  I, Phineas Semen, attending physician, personally viewed and interpreted this EKG  EKG Time: 1223 Rate: 67 Rhythm: sinus rhythm Axis: left axis deviation Intervals: qtc 429 QRS: RBBB, LVH ST changes: no st elevation Impression: abnormal ekg   ____________________________________________    RADIOLOGY  CT head Chronic white matter disease, atrophy. No acute disease.  ____________________________________________   PROCEDURES  Procedures  ____________________________________________   INITIAL IMPRESSION / ASSESSMENT AND PLAN / ED COURSE  Pertinent labs & imaging results that were available during my care of the patient were reviewed by me and considered in my medical decision making (see chart for  details).  Patient presented to the emergency department today because of concerns for confusion by neighbor.  Per notes when she was discharged from the hospital earlier today she was also confused.  At this point she is not oriented.  Spoke with the daughter-in-law who states patient lives by herself.  Workup is concerning for urinary tract infection.  Think this could explain some of the patient's altered mental status.  Will plan on IV antibiotics and admission.  ____________________________________________   FINAL CLINICAL IMPRESSION(S) / ED DIAGNOSES  Final diagnoses:  Lower urinary tract infectious disease  Altered mental status, unspecified altered mental status type     Note: This dictation was prepared with Dragon dictation. Any transcriptional errors that result from this process are unintentional     Phineas Semen, MD 11/09/17 1515

## 2017-11-09 NOTE — ED Triage Notes (Signed)
Pt in via POV with neighbor, pt discharged this morning, pt with Endovascular AAA repair yesterday with over night stay.   Neighbor reports acute confusion since being home this morning.  Pt A/Ox3, thinks that she has yet to have her operation, asking this RN, "Are you going to operate on me?"  Vitals WDL.

## 2017-11-09 NOTE — Plan of Care (Signed)
Pt's family educated re discharge, they will seek a home health aid for her.  PADs off and sites a level 0, pulses WDL, no numbness or tingling.  Pt removed all her monitoring equipment and states she is "going home."  IV sites DC,d no bleeding observed, instructions to family, understanding verbalized.

## 2017-11-09 NOTE — Progress Notes (Signed)
Advanced Care Plan.  Purpose of Encounter: CODE STATUS. Parties in Attendance: The patient, her daughter-in-law and me. Patient's Decisional Capacity: No. Medical Story: Debra Bartlett  is a 84 y.o. female with a known history of AAA, hypertension and hypothyroidism.  She is being placed for observation due to acute metabolic encephalopathy secondary to UTI.  She is very confused and unable to make a decision.  I discussed with her daughter-in-law about the patient's condition and CODE STATUS.  After she discussed with her husband (the patent's son POA).  The wound full code for now. Plan:  Code Status: Full code.  Time spent discussing advance care planning: 17 minutes.

## 2017-11-09 NOTE — Progress Notes (Signed)
[facility]  Daily Progress Note   Subjective  - 1 Day Post-Op  Did well overnight Confused, but that appears to be her baseline  Objective Vitals:   11/09/17 0400 11/09/17 0500 11/09/17 0600 11/09/17 0645  BP: (!) 162/53 (!) 175/84 (!) 186/62 (!) 157/70  Pulse: 66 73 68 63  Resp: 20 (!) 22 16 20   Temp:      TempSrc:      SpO2: 96% 99% 97% 92%  Weight:      Height:        Intake/Output Summary (Last 24 hours) at 11/09/2017 0833 Last data filed at 11/09/2017 16100648 Gross per 24 hour  Intake 2055 ml  Output 1775 ml  Net 280 ml    PULM  CTAB CV  RRR VASC  Access sites C/D/I, feet warm  Laboratory CBC    Component Value Date/Time   WBC 11.7 (H) 11/09/2017 0501   HGB 11.8 (L) 11/09/2017 0501   HCT 35.9 11/09/2017 0501   PLT 107 (L) 11/09/2017 0501    BMET    Component Value Date/Time   NA 141 11/09/2017 0501   K 3.6 11/09/2017 0501   CL 106 11/09/2017 0501   CO2 26 11/09/2017 0501   GLUCOSE 133 (H) 11/09/2017 0501   BUN 18 11/09/2017 0501   CREATININE 0.96 11/09/2017 0501   CALCIUM 8.9 11/09/2017 0501   GFRNONAA 51 (L) 11/09/2017 0501   GFRAA 59 (L) 11/09/2017 0501    Assessment/Planning: POD #1 s/p EVAR   Doing well  Discharge home  RTC 3-4 weeks    Debra BarrenJason Corky Bartlett  11/09/2017, 8:33 AM

## 2017-11-09 NOTE — H&P (Addendum)
[facility] -  at [facility]   PATIENT NAME: Debra Bartlett    MR#:  [id number]  DATE OF BIRTH:  10/07/1924  DATE OF ADMISSION:  11/09/2017  PRIMARY CARE PHYSICIAN: Jaclyn Shaggyate, Denny C, MD   REQUESTING/REFERRING PHYSICIAN: Phineas SemenGoodman, Graydon, MD  CHIEF COMPLAINT:   Chief Complaint  Patient presents with  . Altered Mental Status   Infusion HISTORY OF PRESENT ILLNESS:  Debra Bartlett  is a 84 y.o. female with a known history of AAA, hypertension and hypothyroidism.  The patient got AAA endovascular repair yesterday.  She was sent home with early today and was found confused.  It is said that the patient has been confused for a couple days before the procedure.  The patient was found UTI and treated with Rocephin IV.  CAT scan of the head is unremarkable.  PAST MEDICAL HISTORY:   Past Medical History:  Diagnosis Date  . Abdominal aortic aneurysm (AAA) (HCC) 2019  . Hypertension   . Hypothyroidism     PAST SURGICAL HISTORY:   Past Surgical History:  Procedure Laterality Date  . ENDOVASCULAR REPAIR/STENT GRAFT N/A 11/08/2017   Procedure: ENDOVASCULAR REPAIR/STENT GRAFT;  Surgeon: Annice Needyew, Jason S, MD;  Location: [facility] INVASIVE CV LAB;  Service: Cardiovascular;  Laterality: N/A;  . NO PAST SURGERIES    . TONSILLECTOMY      SOCIAL HISTORY:   Social History   Tobacco Use  . Smoking status: Former Smoker    Types: Cigarettes  . Smokeless tobacco: Never Used  Substance Use Topics  . Alcohol use: No    Frequency: Never    FAMILY HISTORY:   Family History  Family history unknown: Yes  Unable to obtain due to AMS.  DRUG ALLERGIES:  No Known Allergies  REVIEW OF SYSTEMS:   Review of Systems  Unable to perform ROS: Mental status change    MEDICATIONS AT HOME:   Prior to Admission medications   Medication Sig Start Date End Date Taking? Authorizing Provider  acetaminophen (TYLENOL) 325 MG tablet Take 325 mg by mouth every 6 (six) hours as needed for moderate  pain or headache.    Yes [provider]  Ascorbic Acid (VITAMIN C PO) Take 1 tablet by mouth daily.   Yes [provider]  aspirin EC 81 MG tablet Take 1 tablet (81 mg total) by mouth daily. 11/09/17  Yes Dew, Marlow BaarsJason S, MD  Bilberry, Vaccinium myrtillus, (BILBERRY PO) Take 1 tablet by mouth daily.   Yes [provider]  Calcium Carbonate-Vitamin D (CALCIUM-VITAMIN D3 PO) Take 1 tablet by mouth daily.   Yes [provider]  Cholecalciferol (VITAMIN D3 PO) Take 1 capsule by mouth daily.   Yes [provider]  Cyanocobalamin (VITAMIN B-12 PO) Take 1 tablet by mouth daily.   Yes [provider]  diphenhydrAMINE (BENADRYL) 25 mg capsule Take 50 mg by mouth at bedtime.   Yes [provider]  levothyroxine (SYNTHROID, LEVOTHROID) 100 MCG tablet Take 100 mcg by mouth daily.  05/12/17  Yes [provider]  lisinopril (PRINIVIL,ZESTRIL) 5 MG tablet Take 5 mg by mouth daily.   Yes [provider]  ST JOHNS WORT PO Take 1 tablet by mouth daily.   Yes [provider]  VITAMIN E PO Take 1 capsule by mouth daily.   Yes [provider]  Homeopathic Products (RA EAR DROPS HOMEOPATHIC OT) Place 2 drops into both ears daily as needed (for stopped up ear).    [provider]  Melatonin 10 MG CAPS Take 10 mg by mouth at bedtime as needed (for sleep).    [provider]      VITAL SIGNS:  Blood pressure 137/83, pulse 68, temperature 98.7 F (37.1 C), temperature source Oral, height 5\' 6"  (1.676 m), weight 144 lb (65.3 kg), SpO2 98 %.  PHYSICAL EXAMINATION:  Physical Exam  GENERAL:  84 y.o.-year-old patient lying in the bed with no acute distress.  EYES: Pupils equal, round, reactive to light and accommodation. No scleral icterus. Extraocular muscles intact.  HEENT: Head atraumatic, normocephalic. Oropharynx and nasopharynx mild redness.  NECK:  Supple, no jugular venous distention. No thyroid  enlargement, no tenderness.  LUNGS: Normal breath sounds bilaterally, no wheezing, rales,rhonchi or crepitation. No use of accessory muscles of respiration.  CARDIOVASCULAR: S1, S2 normal. No murmurs, rubs, or gallops.  ABDOMEN: Soft, nontender, nondistended. Bowel sounds present. No organomegaly or mass.  EXTREMITIES: No pedal edema, cyanosis, or clubbing.  NEUROLOGIC: Cranial nerves II through XII are intact. Muscle strength 2-3/5 in all extremities. Sensation intact. Gait not checked.  PSYCHIATRIC: The patient is awake but confused, she only knows her name. SKIN: No obvious rash, lesion, or ulcer.   LABORATORY PANEL:   CBC Recent Labs  Lab 11/09/17 1233  WBC 11.2*  HGB 12.2  HCT 37.0  PLT 160   ------------------------------------------------------------------------------------------------------------------  Chemistries  Recent Labs  Lab 11/09/17 1233  NA 138  K 3.9  CL 104  CO2 26  GLUCOSE 125*  BUN 21*  CREATININE 1.14*  CALCIUM 9.6  AST 44*  ALT 18  ALKPHOS 61  BILITOT 0.7   ------------------------------------------------------------------------------------------------------------------  Cardiac Enzymes No results for input(s): TROPONINI in the last 168 hours. ------------------------------------------------------------------------------------------------------------------  RADIOLOGY:  Ct Head Wo Contrast  Result Date: 11/09/2017 CLINICAL DATA:  Confusion. EXAM: CT HEAD WITHOUT CONTRAST TECHNIQUE: Contiguous axial images were obtained from the base of the skull through the vertex without intravenous contrast. COMPARISON:  CT scan of September 28, 2016. FINDINGS: Brain: Mild diffuse cortical atrophy is noted. Mild chronic ischemic white matter disease is noted. No mass effect or midline shift is noted. Ventricular size is within normal limits. There is no evidence of mass lesion, hemorrhage or acute infarction. Vascular: No hyperdense vessel or unexpected  calcification. Skull: Normal. Negative for fracture or focal lesion. Sinuses/Orbits: No acute finding. Other: None. IMPRESSION: Mild diffuse cortical atrophy. Mild chronic ischemic white matter disease. No acute intracranial abnormality seen. Electronically Signed   By: Lupita Raider, M.D.   On: 11/09/2017 13:07      IMPRESSION AND PLAN:   Acute metabolic encephalopathy, possible due to UTI. The patient will be placed for observation. Aspiration fall precaution, continue Rocephin and follow-up urine culture.  Hypertension urgency, SBP 214,  continue lisinopril and IV hydralazine as needed.  Dehydration.  Gentle IV fluids support and follow-up BMP.  Hypothyroidism.  Continue Synthroid.  AAA, status post endovascular repair yesterday.  All the records are reviewed and case discussed with ED provider. Management plans discussed with the patient's daughter-in-law and they are in agreement.  CODE STATUS: Full code  TOTAL TIME TAKING CARE OF THIS PATIENT: 46 minutes.    Shaune Pollack M.D on 11/09/2017 at 3:24 PM  Between 7am to 6pm - Pager - [phone]  After 6pm go to [email or web address] - Social research officer, government  [facility] [place]  Office  [phone]  CC: Primary care physician; Jaclyn Shaggy, MD   Note: This dictation was prepared with Dragon dictation  along with smaller phrase technology. Any transcriptional errors that result from this process are unin

## 2017-11-10 ENCOUNTER — Encounter: Payer: Self-pay | Admitting: Internal Medicine

## 2017-11-10 LAB — CBC
HEMATOCRIT: 37.1 % (ref 35.0–47.0)
Hemoglobin: 12.4 g/dL (ref 12.0–16.0)
MCH: 31 pg (ref 26.0–34.0)
MCHC: 33.4 g/dL (ref 32.0–36.0)
MCV: 92.9 fL (ref 80.0–100.0)
Platelets: 129 10*3/uL — ABNORMAL LOW (ref 150–440)
RBC: 3.99 MIL/uL (ref 3.80–5.20)
RDW: 15.6 % — AB (ref 11.5–14.5)
WBC: 12.4 10*3/uL — ABNORMAL HIGH (ref 3.6–11.0)

## 2017-11-10 LAB — BASIC METABOLIC PANEL
Anion gap: 8 (ref 5–15)
BUN: 16 mg/dL (ref 6–20)
CHLORIDE: 106 mmol/L (ref 101–111)
CO2: 23 mmol/L (ref 22–32)
Calcium: 8.6 mg/dL — ABNORMAL LOW (ref 8.9–10.3)
Creatinine, Ser: 0.84 mg/dL (ref 0.44–1.00)
GFR calc Af Amer: >60 mL/min (ref >60)
GFR calc non Af Amer: 59 mL/min — ABNORMAL LOW (ref >60)
GLUCOSE: 110 mg/dL — AB (ref 65–99)
POTASSIUM: 3.6 mmol/L (ref 3.5–5.1)
Sodium: 137 mmol/L (ref 135–145)

## 2017-11-10 LAB — URINE CULTURE: Culture: NO GROWTH

## 2017-11-10 MED ORDER — POLYETHYLENE GLYCOL 3350 17 G PO PACK
17.0000 g | PACK | Freq: Every day | ORAL | Status: DC
  Administered 2017-11-10 – 2017-11-12 (×3): 17 g via ORAL
  Filled 2017-11-10 (×3): qty 1

## 2017-11-10 MED ORDER — AMLODIPINE BESYLATE 5 MG PO TABS
5.0000 mg | ORAL_TABLET | Freq: Every day | ORAL | Status: DC
  Administered 2017-11-11 – 2017-11-12 (×2): 5 mg via ORAL
  Filled 2017-11-10 (×2): qty 1

## 2017-11-10 NOTE — Care Management Obs Status (Signed)
MEDICARE OBSERVATION STATUS NOTIFICATION   Patient Details  Name: Debra Bartlett MRN: [id number] Date of Birth: 08/08/1925   Medicare Observation Status Notification Given:  Yes. Permission from Ms. Brooke DareKing to sign    Gwenette GreetBrenda S Domenico Achord, RN 11/10/2017, 10:26 AM

## 2017-11-10 NOTE — Progress Notes (Signed)
Pharmacy Antibiotic Note  Debra Bartlett is a 84 y.o. female admitted on 11/09/2017 with UTI.  Pharmacy has been consulted for ceftriaxone dosing.  This is day #2 of IV antibiotics.  Plan: Continue ceftriaxone 1 g IV daily  Height: 5\' 6"  (167.6 cm) Weight: 144 lb (65.3 kg) IBW/kg (Calculated) : 59.3  Temp (24hrs), Avg:99.4 F (37.4 C), Min:98.4 F (36.9 C), Max:99.8 F (37.7 C)  Recent Labs  Lab 11/09/17 0501 11/09/17 1233 11/10/17 0425  WBC 11.7* 11.2* 12.4*  CREATININE 0.96 1.14* 0.84    Estimated Creatinine Clearance: 41.7 mL/min (by C-G formula based on SCr of 0.84 mg/dL).    No Known Allergies  Antimicrobials this admission: Ceftriaxone 2/7 >  Microbiology results: 2/7 Urine: Sent  Thank you for allowing pharmacy to be a part of this patient's care.  Debra Bartlett, PharmD, BCPS Clinical Pharmacist 11/10/2017 9:56 AM

## 2017-11-10 NOTE — Progress Notes (Signed)
Patient ID: Debra Bartlett, female   DOB: [birth year], 84 y.o.   MRN: [id number]  [facility] PROGRESS NOTE  LOLITHA TORTORA ZYS:[id number] DOB: [birth year] DOA: 11/09/2017 PCP: Jaclyn Shaggy, MD  HPI/Subjective: Patient was wondering if we did the surgery on her.  She stated that she had an aneurysm in her abdomen and this is came.  I told her that she had that procedure done and this time she was admitted for urinary tract infection.  Patient feels okay.  She states she feels a little stiff in her legs.   Objective: Vitals:   11/10/17 1117 11/10/17 1458  BP: (!) 146/56 (!) 153/45  Pulse: 69 71  Resp:  (!) 28  Temp:  100.2 F (37.9 C)  SpO2:  98%    Intake/Output Summary (Last 24 hours) at 11/10/2017 1512 Last data filed at 11/10/2017 1300 Gross per 24 hour  Intake 959.17 ml  Output -  Net 959.17 ml   Filed Weights   11/09/17 1226  Weight: 65.3 kg (144 lb)    ROS: Review of Systems  Constitutional: Negative for chills and fever.  Eyes: Negative for blurred vision.  Respiratory: Negative for cough and shortness of breath.   Cardiovascular: Negative for chest pain.  Gastrointestinal: Negative for abdominal pain, constipation, diarrhea, nausea and vomiting.  Genitourinary: Negative for dysuria.  Musculoskeletal: Negative for joint pain.  Neurological: Negative for dizziness and headaches.   Exam: Physical Exam  HENT:  Nose: No mucosal edema.  Mouth/Throat: No oropharyngeal exudate or posterior oropharyngeal edema.  Eyes: Conjunctivae, EOM and lids are normal. Pupils are equal, round, and reactive to light.  Neck: No JVD present. Carotid bruit is not present. No edema present. No thyroid mass and no thyromegaly present.  Cardiovascular: S1 normal and S2 normal. Exam reveals no gallop.  Murmur heard.  Systolic murmur is present with a grade of 3/6. Pulses:      Dorsalis pedis pulses are 2+ on the right side, and 2+ on the left side.  Respiratory: No respiratory distress. She has  no wheezes. She has no rhonchi. She has no rales.  GI: Soft. Bowel sounds are normal. There is no tenderness.  Musculoskeletal:       Right ankle: She exhibits no swelling.       Left ankle: She exhibits no swelling.  Lymphadenopathy:    She has no cervical adenopathy.  Neurological: She is alert. No cranial nerve deficit.  Skin: Skin is warm. No rash noted. Nails show no clubbing.  Psychiatric: She has a normal mood and affect.      Data Reviewed: Basic Metabolic Panel: Recent Labs  Lab 11/09/17 0501 11/09/17 1233 11/10/17 0425  NA 141 138 137  K 3.6 3.9 3.6  CL 106 104 106  CO2 26 26 23   GLUCOSE 133* 125* 110*  BUN 18 21* 16  CREATININE 0.96 1.14* 0.84  CALCIUM 8.9 9.6 8.6*   Liver Function Tests: Recent Labs  Lab 11/09/17 1233  AST 44*  ALT 18  ALKPHOS 61  BILITOT 0.7  PROT 6.9  ALBUMIN 3.8   CBC: Recent Labs  Lab 11/09/17 0501 11/09/17 1233 11/10/17 0425  WBC 11.7* 11.2* 12.4*  HGB 11.8* 12.2 12.4  HCT 35.9 37.0 37.1  MCV 93.6 93.6 92.9  PLT 107* 160 129*    CBG: Recent Labs  Lab 11/08/17 1053  GLUCAP 109*    Recent Results (from the past 240 hour(s))  MRSA PCR Screening     Status:  None   Collection Time: 11/08/17 11:00 AM  Result Value Ref Range Status   MRSA by PCR NEGATIVE NEGATIVE Final    Comment:        The GeneXpert MRSA Assay (FDA approved for NASAL specimens only), is one component of a comprehensive MRSA colonization surveillance program. It is not intended to diagnose MRSA infection nor to guide or monitor treatment for MRSA infections. Performed at [facility], [address], [place], [place] [address]   Urine Culture     Status: None   Collection Time: 11/09/17 12:33 PM  Result Value Ref Range Status   Specimen Description   Final    URINE, RANDOM Performed at [facility], [address] [place], [place] [address]    Special Requests   Final    NONE Performed at [facility], [address] [place], [place] [address]    Culture   Final    NO GROWTH Performed at [facility], [address] [place], [place] [address]    Report Status 11/10/2017 FINAL  Final     Studies: Ct Head Wo Contrast  Result Date: 11/09/2017 CLINICAL DATA:  Confusion. EXAM: CT HEAD WITHOUT CONTRAST TECHNIQUE: Contiguous axial images were obtained from the base of the skull through the vertex without intravenous contrast. COMPARISON:  CT scan of September 28, 2016. FINDINGS: Brain: Mild diffuse cortical atrophy is noted. Mild chronic ischemic white matter disease is noted. No mass effect or midline shift is noted. Ventricular size is within normal limits. There is no evidence of mass lesion, hemorrhage or acute infarction. Vascular: No hyperdense vessel or unexpected calcification. Skull: Normal. Negative for fracture or focal lesion. Sinuses/Orbits: No acute finding. Other: None. IMPRESSION: Mild diffuse cortical atrophy. Mild chronic ischemic white matter disease. No acute intracranial abnormality seen. Electronically Signed   By: Lupita Raider, M.D.   On: 11/09/2017 13:07    Scheduled Meds: . aspirin EC  81 mg Oral Daily  . diphenhydrAMINE  50 mg Oral QHS  . enoxaparin (LOVENOX) injection  40 mg Subcutaneous Q24H  . levothyroxine  100 mcg Oral QAC breakfast  . lisinopril  5 mg Oral Daily  . polyethylene glycol  17 g Oral Daily   Continuous Infusions: . sodium chloride 50 mL/hr at 11/09/17 1900  . cefTRIAXone (ROCEPHIN)  IV      Assessment/Plan:  1. Acute encephalopathy, acute cystitis, dehydration.  Patient's better today.  Rocephin for urinary tract infection.  Gentle IV fluid hydration. 2. Abdominal aortic aneurysm status post endovascular repair/stent graft.  On aspirin. 3. Hypothyroidism unspecified on levothyroxine 4. Essential hypertension on lisinopril 5. Weakness.  Physical therapy recommended rehab  Family was concerned about her living by herself.  Code Status:      Code Status Orders  (From admission, onward)        Start     Ordered   11/09/17 1855  Full code  Continuous     11/09/17 1854    Code Status History    Date Active Date Inactive Code Status Order ID Comments User Context   11/08/2017 10:52 11/09/2017 12:04 Full Code [id number]  Annice Needy, MD Inpatient    Advance Directive Documentation     Most Recent Value  Type of Advance Directive  Living will  Pre-existing out of facility DNR order (yellow form or pink MOST form)  No data  "MOST" Form in Place?  No data     Family Communication: Left  message for Bethann Berkshirerisha at [phone] Disposition Plan: Consulting social worker for rehab  Antibiotics:   Rocephin  Time spent: 28 minutes  Curlie Sittner [employer]  [employer]

## 2017-11-10 NOTE — Care Management Note (Signed)
Case Management Note  Patient Details  Name: Debra Bartlett MRN: [id number] Date of Birth: 03/25/1925  Subjective/Objective:     Admitted to [facility] under observation status with the diagnosis of acute metabolic encephalopathy. Lives alone. Son is [place] [phone]). [facility] [address]. Followed by [facility] for home health currently. No skilled facility. No home oxygen. Rolling walker in the home, if needed. Takes care of all basic activities of daily living herself, drives. Multiple falls over the last month. Good appetite                Action/Plan: Physical therapy evaluation completed. Recommending skilled nursing facility. Will continue to follow, if needed   Expected Discharge Date:                  Expected Discharge Plan:     In-House Referral:     Discharge planning Services     Post Acute Care Choice:    Choice offered to:     DME Arranged:    DME Agency:     HH Arranged:    HH Agency:     Status of Service:     If discussed at [employer] Meetings, dates discussed:    Additional Comments:  Gwenette GreetBrenda S Madalyn Legner, RN MSN CCM Care Management [phone] 11/10/2017, 10:16 AM

## 2017-11-10 NOTE — Evaluation (Signed)
Physical Therapy Evaluation Patient Details Name: Debra Bartlett MRN: [id number] DOB: 04/23/1925 Today's Date: 11/10/2017   History of Present Illness  Pt is a 84 y/o F who had an AAA endovacular repair performed on 11/08/17 who was sent home on 2/7 and was found confused.  Pt found to have UTI.  CAT scan of the head unremarkable.       Clinical Impression  Pt admitted with above diagnosis. Pt currently with functional limitations due to the deficits listed below (see PT Problem List).  Pt demonstrates instability with transfers and short distance ambulation this session. BP supine at start of session 152/57, sitting EOB 150/91, 144/91 sitting EOB after resting several minutes, sitting inchair after short ambulation 145/67.  RN made aware of BP readings.  Pt lives alone and is a high fall risk.  Given pt's current mobility status, recommending SNF at d/c.  Pt will benefit from skilled PT to increase their independence and safety with mobility to allow discharge to the venue listed below.      Follow Up Recommendations SNF    Equipment Recommendations  None recommended by PT    Recommendations for Other Services       Precautions / Restrictions Precautions Precautions: Fall;Other (comment) Precaution Comments: monitor BP, recent AAA Restrictions Weight Bearing Restrictions: No      Mobility  Bed Mobility Overal bed mobility: Needs Assistance Bed Mobility: Supine to Sit     Supine to sit: Min guard     General bed mobility comments: Increased time and effort.  Min guard for safety as pt moves slowly and demonstrates mild postural instability at trunk.   Transfers Overall transfer level: Needs assistance Equipment used: Rolling walker (2 wheeled) Transfers: Sit to/from Stand Sit to Stand: Min guard         General transfer comment: Close min guard as pt rises slowly to stand and demonstrates unsteadiness.  When sitting pt keeps hand on RW rather than reaching back for armrest of  chair.   Ambulation/Gait Ambulation/Gait assistance: Min guard Ambulation Distance (Feet): 20 Feet Assistive device: Rolling walker (2 wheeled) Gait Pattern/deviations: Step-through pattern;Decreased stride length;Antalgic;Trunk flexed Gait velocity: decreased Gait velocity interpretation: Below normal speed for age/gender General Gait Details: Pt with decreased gait speed and bumping into obstacles with RW when ambulating in room.  Pt with flexed posture and does not keep feet inside BOS of RW at all times.   Stairs            Wheelchair Mobility    Modified Rankin (Stroke Patients Only)       Balance Overall balance assessment: Needs assistance;History of Falls Sitting-balance support: No upper extremity supported;Feet supported Sitting balance-Leahy Scale: Fair     Standing balance support: Bilateral upper extremity supported;During functional activity Standing balance-Leahy Scale: Poor Standing balance comment: Relies on UE support for static and dynamic activities                             Pertinent Vitals/Pain Pain Assessment: Faces Faces Pain Scale: Hurts a little bit Pain Location: "all over" Pain Descriptors / Indicators: Discomfort Pain Intervention(s): Limited activity within patient's tolerance;Monitored during session;Repositioned    Home Living Family/patient expects to be discharged to:: Skilled nursing facility Living Arrangements: Alone Available Help at Discharge: Neighbor;Available PRN/intermittently Type of Home: House Home Access: Stairs to enter Entrance Stairs-Rails: Right Entrance Stairs-Number of Steps: 4 Home Layout: Two level;Able to live on main level  with bedroom/bathroom Home Equipment: Walker - 2 wheels;Cane - single point      Prior Function Level of Independence: Independent with assistive device(s)         Comments: Pt reports that she occasionally uses RW or cane if she feels unsteady.  She reports at least  5 falls in the past 6 months, one of which was down her concrete steps to exit home.  Pt still driving.  Takes a sponge bath.  Is independent with dressing.  Does not cook, eats out and heats frozen meals.       Hand Dominance        Extremity/Trunk Assessment   Upper Extremity Assessment Upper Extremity Assessment: (4+/5 BUE grossly)    Lower Extremity Assessment Lower Extremity Assessment: (BLE strength grossly 4/5)       Communication   Communication: No difficulties  Cognition Arousal/Alertness: Awake/alert Behavior During Therapy: WFL for tasks assessed/performed Overall Cognitive Status: No family/caregiver present to determine baseline cognitive functioning                                 General Comments: Pt says "I don't know" when asked which month it is but then responds correctly with February.  She has delayed responses at times and requries explaining of simple ideas at times.       General Comments General comments (skin integrity, edema, etc.): BP supine at start of session 152/57, sitting EOB 150/91, 144/91 sitting EOB after resting several minutes, sitting inchair after short ambulation 145/67.  SpO2 remains at or above 96% on RA throughout session.      Exercises     Assessment/Plan    PT Assessment Patient needs continued PT services  PT Problem List Decreased strength;Decreased activity tolerance;Decreased balance;Decreased cognition;Decreased knowledge of use of DME;Decreased safety awareness       PT Treatment Interventions DME instruction;Gait training;Stair training;Functional mobility training;Therapeutic activities;Therapeutic exercise;Balance training;Neuromuscular re-education;Patient/family education;Cognitive remediation    PT Goals (Current goals can be found in the Care Plan section)  Acute Rehab PT Goals Patient Stated Goal: rehab before home PT Goal Formulation: With patient Time For Goal Achievement: 11/24/17 Potential  to Achieve Goals: Good    Frequency Min 2X/week   Barriers to discharge Decreased caregiver support Lives alone    Co-evaluation               AM-PAC PT "6 Clicks" Daily Activity  Outcome Measure Difficulty turning over in bed (including adjusting bedclothes, sheets and blankets)?: None Difficulty moving from lying on back to sitting on the side of the bed? : A Little Difficulty sitting down on and standing up from a chair with arms (e.g., wheelchair, bedside commode, etc,.)?: A Lot Help needed moving to and from a bed to chair (including a wheelchair)?: A Little Help needed walking in hospital room?: A Little Help needed climbing 3-5 steps with a railing? : A Lot 6 Click Score: 17    End of Session Equipment Utilized During Treatment: Gait belt Activity Tolerance: Patient tolerated treatment well;Treatment limited secondary to medical complications (Comment)(elevated BP) Patient left: in chair;with call bell/phone within reach;with chair alarm set Nurse Communication: Mobility status;Other (comment)(BP readings) PT Visit Diagnosis: Unsteadiness on feet (R26.81);Muscle weakness (generalized) (M62.81);History of falling (Z91.81);Repeated falls (R29.6)    Time: [id number] PT Time Calculation (min) (ACUTE ONLY): 28 min   Charges:   PT Evaluation $PT Eval Low Complexity: 1 Low PT Treatments $Gait  Training: 8-22 mins   PT G Codes:        Encarnacion Chu PT, DPT 11/10/2017, 10:41 AM

## 2017-11-11 NOTE — Clinical Social Work Note (Signed)
CSW received consult that PT has recommended STR. CSW will assess when able.  Skii Cleland Martha Treyon Wymore, MSW, LCSWA [phone] 

## 2017-11-11 NOTE — Progress Notes (Addendum)
Patient ID: Debra Russellmma R Prigmore, female   DOB: 08/20/1925, 84 y.o.   MRN: [id number]  [facility] PROGRESS NOTE  Debra Bartlett [id number] DOB: 04/27/1925 DOA: 11/09/2017 PCP: Jaclyn Shaggyate, Denny C, MD  HPI/Subjective:  Patient is more awake.  Sitting in bed combing her hair.  Her neighbor and friend at bedside confirms patient is almost at her baseline.  Afebrile.  Objective: Vitals:   11/11/17 0349 11/11/17 0825  BP: (!) 153/59 (!) 152/59  Pulse: 87 75  Resp: 14 16  Temp: 98.7 F (37.1 C) 99.1 F (37.3 C)  SpO2: 97% 97%    Intake/Output Summary (Last 24 hours) at 11/11/2017 1443 Last data filed at 11/10/2017 2320 Gross per 24 hour  Intake 1953.83 ml  Output -  Net 1953.83 ml   Filed Weights   11/09/17 1226  Weight: 65.3 kg (144 lb)    ROS: Review of Systems  Unable to perform ROS: Dementia   Exam: Physical Exam  HENT:  Nose: No mucosal edema.  Mouth/Throat: No oropharyngeal exudate or posterior oropharyngeal edema.  Eyes: Conjunctivae, EOM and lids are normal. Pupils are equal, round, and reactive to light.  Neck: No JVD present. Carotid bruit is not present. No edema present. No thyroid mass and no thyromegaly present.  Cardiovascular: S1 normal and S2 normal. Exam reveals no gallop.  Murmur heard.  Systolic murmur is present with a grade of 3/6. Pulses:      Dorsalis pedis pulses are 2+ on the right side, and 2+ on the left side.  Respiratory: No respiratory distress. She has no wheezes. She has no rhonchi. She has no rales.  GI: Soft. Bowel sounds are normal. There is no tenderness.  Musculoskeletal:       Right ankle: She exhibits no swelling.       Left ankle: She exhibits no swelling.  Lymphadenopathy:    She has no cervical adenopathy.  Neurological: She is alert. No cranial nerve deficit.  Skin: Skin is warm. No rash noted. Nails show no clubbing.  Psychiatric: She has a normal mood and affect.      Data Reviewed: Basic Metabolic Panel: Recent Labs  Lab  11/09/17 0501 11/09/17 1233 11/10/17 0425  NA 141 138 137  K 3.6 3.9 3.6  CL 106 104 106  CO2 26 26 23   GLUCOSE 133* 125* 110*  BUN 18 21* 16  CREATININE 0.96 1.14* 0.84  CALCIUM 8.9 9.6 8.6*   Liver Function Tests: Recent Labs  Lab 11/09/17 1233  AST 44*  ALT 18  ALKPHOS 61  BILITOT 0.7  PROT 6.9  ALBUMIN 3.8   CBC: Recent Labs  Lab 11/09/17 0501 11/09/17 1233 11/10/17 0425  WBC 11.7* 11.2* 12.4*  HGB 11.8* 12.2 12.4  HCT 35.9 37.0 37.1  MCV 93.6 93.6 92.9  PLT 107* 160 129*    CBG: Recent Labs  Lab 11/08/17 1053  GLUCAP 109*    Recent Results (from the past 240 hour(s))  MRSA PCR Screening     Status: None   Collection Time: 11/08/17 11:00 AM  Result Value Ref Range Status   MRSA by PCR NEGATIVE NEGATIVE Final    Comment:        The GeneXpert MRSA Assay (FDA approved for NASAL specimens only), is one component of a comprehensive MRSA colonization surveillance program. It is not intended to diagnose MRSA infection nor to guide or monitor treatment for MRSA infections. Performed at [facility], [address] [place], [place] [address]  Urine Culture     Status: None   Collection Time: 11/09/17 12:33 PM  Result Value Ref Range Status   Specimen Description   Final    URINE, RANDOM Performed at [facility], [address], [place], [place] [address]    Special Requests   Final    NONE Performed at [facility], [address] [place], [place] [address]    Culture   Final    NO GROWTH Performed at [facility], [address], [place], [place] [address]    Report Status 11/10/2017 FINAL  Final     Studies: No results found.  Scheduled Meds: . amLODipine  5 mg Oral Daily  . aspirin EC  81 mg Oral Daily  . diphenhydrAMINE  50 mg Oral QHS  . enoxaparin (LOVENOX) injection  40 mg Subcutaneous Q24H  . levothyroxine  100 mcg Oral QAC breakfast  . lisinopril  5 mg Oral Daily  . polyethylene glycol   17 g Oral Daily   Continuous Infusions: . cefTRIAXone (ROCEPHIN)  IV Stopped (11/10/17 1813)    Assessment/Plan:  1. Acute encephalopathy, acute cystitis, dehydration.  Patient's close to baseline today Rocephin for urinary tract infection.   Stop IV fluids 2. Abdominal aortic aneurysm status post endovascular repair/stent graft.  On aspirin. 3. Hypothyroidism unspecified on levothyroxine 4. Essential hypertension on lisinopril 5. Weakness.  Physical therapy recommended rehab 6. Dementia.  Monitor for inpatient delirium.  Code Status:     Code Status Orders  (From admission, onward)        Start     Ordered   11/09/17 1855  Full code  Continuous     11/09/17 1854    Code Status History    Date Active Date Inactive Code Status Order ID Comments User Context   11/08/2017 10:52 11/09/2017 12:04 Full Code [id number]  Annice Needy, MD Inpatient    Advance Directive Documentation     Most Recent Value  Type of Advance Directive  Living will  Pre-existing out of facility DNR order (yellow form or pink MOST form)  No data  "MOST" Form in Place?  No data     Family Communication:  Bethann Berkshire at [phone] Disposition Plan: SNF vs Home with HH  Antibiotics:   Rocephin  Time spent: 28 minutes  [employer] R Akeema Broder  [employer]

## 2017-11-11 NOTE — Progress Notes (Signed)
Advance care planning  Patient has dementia and unable to make decisions.  She is requesting to go home.  Called family from bedside.  Discussed with [place]. Patient lives alone and has had confusion.  Neighbor checks on her frequently.  She has had worsening confusion since her hospital stay with abdominal aortic aneurysm repair.  Discussed with family regarding her UTI/dehydration and dementia.  Patient is slowly returning to baseline.  Physical therapy worked with patient and recommended skilled nursing facility.  We discussed regarding patient's observations status and likely private pay for sniff.  Family is concerned patient lives alone and need time to set up people to stay with patient at home.  They are concerned that patient would not be able to live alone with her dementia.  I agree.  Discussed regarding dementia scores and likely worsening in the future.  CODE STATUS full code  Time spent 20 minutes

## 2017-11-11 NOTE — Care Management Note (Addendum)
Case Management Note  Patient Details  Name: Ballard Russellmma R Cassata MRN: [id number] Date of Birth: 09/06/1925  Subjective/Objective:     CSW has discussed home with home health vs rehab with family today. Son and daughter-in-law will advise us of their decision tomorrow. Mrs Brooke DareKing has a RW at home. Mrs Brooke DareKing lives alone. Mrs Brooke DareKing is an Observation patient and her insurance will not pay for SNF. Anticipate home with home health per any other arrangement would be an out-of-pocket expense for the family. Will discuss home needs with family tomorrow after they have discussed it among themselves tonight.                Action/Plan:   Expected Discharge Date:                  Expected Discharge Plan:     In-House Referral:     Discharge planning Services     Post Acute Care Choice:    Choice offered to:     DME Arranged:    DME Agency:     HH Arranged:    HH Agency:     Status of Service:     If discussed at [employer] Length of Stay Meetings, dates discussed:    Additional Comments:  Aaminah Forrester A, RN 11/11/2017, 4:22 PM

## 2017-11-11 NOTE — [place] FL2 (Signed)
[place] MEDICAID FL2 LEVEL OF CARE SCREENING TOOL     IDENTIFICATION  Patient Name: Debra Bartlett Birthdate: August 22, 1925 Sex: female Admission Date (Current Location): 11/09/2017  [place] and [place] Number:  Chiropodist and Address:  [facility], [address], [place], [place] [address]      Provider Number: [id number]  Attending Physician Name and Address:  Milagros Loll, MD  Relative Name and Phone Number:  Anissia Wessells [phone]) Son    Current Level of Care: Hospital Recommended Level of Care: Skilled Nursing Facility Prior Approval Number:    Date Approved/Denied:   PASRR Number: [id number]  Discharge Plan: SNF    Current Diagnoses: Patient Active Problem List   Diagnosis Date Noted  . Acute metabolic encephalopathy 11/09/2017  . Hypertension 09/19/2017  . AAA (abdominal aortic aneurysm) without rupture (HCC) 09/19/2017    Orientation RESPIRATION BLADDER Height & Weight     Time, Self, Situation, Place  Normal Continent Weight: 144 lb (65.3 kg) Height:  5\' 6"  (167.6 cm)  BEHAVIORAL SYMPTOMS/MOOD NEUROLOGICAL BOWEL NUTRITION STATUS      Continent Diet(Heart healthy)  AMBULATORY STATUS COMMUNICATION OF NEEDS Skin   Extensive Assist Verbally Normal                       Personal Care Assistance Level of Assistance  Bathing, Feeding, Dressing Bathing Assistance: Limited assistance Feeding assistance: Independent Dressing Assistance: Limited assistance     Functional Limitations Info             SPECIAL CARE FACTORS FREQUENCY  PT (By licensed PT)     PT Frequency: 5/week              Contractures Contractures Info: Not present    Additional Factors Info  Code Status, Allergies Code Status Info: Full Allergies Info: No Known Allergies           Current Medications (11/11/2017):  This is the current hospital active medication list Current Facility-Administered Medications  Medication Dose  Route Frequency Provider Last Rate Last Dose  . acetaminophen (TYLENOL) tablet 650 mg  650 mg Oral Q6H PRN Shaune Pollack, MD       Or  . acetaminophen (TYLENOL) suppository 650 mg  650 mg Rectal Q6H PRN Shaune Pollack, MD      . albuterol (PROVENTIL) (2.5 MG/3ML) 0.083% nebulizer solution 2.5 mg  2.5 mg Nebulization Q2H PRN Shaune Pollack, MD      . amLODipine (NORVASC) tablet 5 mg  5 mg Oral Daily Milagros Loll, MD   5 mg at 11/11/17 8469  . aspirin EC tablet 81 mg  81 mg Oral Daily Shaune Pollack, MD   81 mg at 11/11/17 6295  . bisacodyl (DULCOLAX) EC tablet 5 mg  5 mg Oral Daily PRN Shaune Pollack, MD      . cefTRIAXone (ROCEPHIN) 1 g in dextrose 5 % 50 mL IVPB  1 g Intravenous Q24H Coffee, Gerre Pebbles, [facility]   Stopped at 11/10/17 1813  . diphenhydrAMINE (BENADRYL) capsule 50 mg  50 mg Oral QHS Shaune Pollack, MD   50 mg at 11/10/17 2111  . enoxaparin (LOVENOX) injection 40 mg  40 mg Subcutaneous Q24H Shaune Pollack, MD   40 mg at 11/10/17 2111  . hydrALAZINE (APRESOLINE) injection 10 mg  10 mg Intravenous Q6H PRN Shaune Pollack, MD   10 mg at 11/09/17 1812  . HYDROcodone-acetaminophen (NORCO/VICODIN) 5-325 MG per tablet 1-2 tablet  1-2 tablet Oral Q4H PRN  Shaune Pollackhen, Qing, MD      . levothyroxine Erline Levine(SYNTHROID, LEVOTHROID) tablet 100 mcg  100 mcg Oral QAC breakfast Shaune Pollackhen, Qing, MD   100 mcg at 11/11/17 0609  . lisinopril (PRINIVIL,ZESTRIL) tablet 5 mg  5 mg Oral Daily Shaune Pollackhen, Qing, MD   5 mg at 11/11/17 [id number]  . Melatonin TABS 10 mg  10 mg Oral QHS PRN Shaune Pollackhen, Qing, MD      . ondansetron [facility](ZOFRAN) tablet 4 mg  4 mg Oral Q6H PRN Shaune Pollackhen, Qing, MD       Or  . ondansetron [facility](ZOFRAN) injection 4 mg  4 mg Intravenous Q6H PRN Shaune Pollackhen, Qing, MD      . polyethylene glycol (MIRALAX / GLYCOLAX) packet 17 g  17 g Oral Daily Alford HighlandWieting, Richard, MD   17 g at 11/11/17 [phone]  . senna-docusate (Senokot-S) tablet 1 tablet  1 tablet Oral QHS PRN Shaune Pollackhen, Qing, MD         Discharge Medications: Please see discharge summary for a list of discharge medications.  Relevant  Imaging Results:  Relevant Lab Results:   Additional Information SS# [id number]  Judi CongKaren M Delman Goshorn, LCSW

## 2017-11-11 NOTE — Progress Notes (Signed)
Pleasantly confused. No c/o pain nor distress noted. Tele sitter in place. Continue to monitor

## 2017-11-11 NOTE — Clinical Social Work Note (Signed)
Clinical Social Work Assessment  Patient Details  Name: Debra Bartlett MRN: [id number] Date of Birth: 08/10/1925  Date of referral:  11/11/17               Reason for consult:  Facility Placement                Permission sought to share information with:  Facility Industrial/product designerContact Representative Permission granted to share information::  Yes, Verbal Permission Granted  Name::        Agency::     Relationship::     Contact Information:     Housing/Transportation Living arrangements for the past 2 months:  Single Family Home Source of Information:  Medical Team, Adult Children Patient Interpreter Needed:  None Criminal Activity/Legal Involvement Pertinent to Current Situation/Hospitalization:  No - Comment as needed Significant Relationships:  Merchandiser, retailCommunity Support, Adult Children Lives with:  Self Do you feel safe going back to the place where you live?  Yes Need for family participation in patient care:  No (Coment)  Care giving concerns:  PT recommendation for STR/Patient is under observation status with managed Medicare that does not typically authorize SNF for observation patients.  Social Worker assessment / plan:  CSW spoke with the patient's daughter-in-law Rosann Auerbachrish about the recommendation for STR. The CSW explained the insurance barrier and the options of either private pay until [employer] authorizes the stay with the possibility of reimbursement from the insurance provider vs. Home with Home health. The CSW stated the realistic potential of authorization from this insurance provider as limited based on past experience. Rosann Auerbachrish indicated that she would discuss such with her husband. Unfortunately, Rosann Auerbachrish and her husband are currently assisting a friend whose spouse passed away today. The CSW provided emotional support. Trish plans to call the CSW back with the decision when able, today.  The CSW has, with Trish's permission, begun the bed search for a SNF should the family choose that discharge plan. CSW  is following.  Employment status:  Retired Database administratornsurance information:  Managed Medicare PT Recommendations:  Skilled Nursing Facility Information / Referral to community resources:  Skilled Nursing Facility  Patient/Family's Response to care:  The patient's daughter-in-law thanked the CSW for assistance.  Patient/Family's Understanding of and Emotional Response to Diagnosis, Current Treatment, and Prognosis:  The family understands the insurance barrier and are considering their options.   Emotional Assessment Appearance:  Appears stated age Attitude/Demeanor/Rapport:  Gracious Affect (typically observed):  Accepting, Appropriate, Pleasant Orientation:  Oriented to Self, Oriented to Place, Oriented to  Time, Oriented to Situation Alcohol / Substance use:  Never Used Psych involvement (Current and /or in the community):  No (Comment)  Discharge Needs  Concerns to be addressed:  Care Coordination, Discharge Planning Concerns Readmission within the last 30 days:  Yes Current discharge risk:  Lives alone Barriers to Discharge:  Continued Medical Work up, Insurance Authorization   Judi CongKaren M Iyanla Eilers, LCSW 11/11/2017, 11:46 AM

## 2017-11-12 MED ORDER — CEPHALEXIN 250 MG PO CAPS: 250.0000 mg | ORAL_CAPSULE | Freq: Three times a day (TID) | ORAL | 0 refills | Status: AC

## 2017-11-12 MED ORDER — AMLODIPINE BESYLATE 5 MG PO TABS: 5.0000 mg | ORAL_TABLET | Freq: Every day | ORAL | 0 refills | Status: AC

## 2017-11-12 MED ORDER — LISINOPRIL 20 MG PO TABS
20.0000 mg | ORAL_TABLET | Freq: Every day | ORAL | Status: DC
  Administered 2017-11-12: 10:00:00 20 mg via ORAL
  Filled 2017-11-12: qty 1

## 2017-11-12 NOTE — Plan of Care (Signed)
VSS, free of falls during shift.  Denies pain, no complaints overnight.  No calls to RN from Soil scientisttele sitter.  Impulsive at times when needing to ambulate to BR/BSC.  Bed in low position, call bell within reach.  WCTM.

## 2017-11-12 NOTE — Care Management Note (Signed)
Case Management Note  Patient Details  Name: Debra Bartlett MRN: [id number] Date of Birth: 10/04/1924  Subjective/Objective:      Family has decided on home health. A referral was called to Earnest Rosieriffany Adams at [facility] to request a resumption of care for HH=PT, RN, Aide. [name] already has a RW at home.               Action/Plan:   Expected Discharge Date:  11/12/17               Expected Discharge Plan:  Home w Home Health Services  In-House Referral:     Discharge planning Services  CM Consult  Post Acute Care Choice:    Choice offered to:     DME Arranged:    DME Agency:     HH Arranged:  RN, PT, Nurse's Aide HH Agency:  [facility]  Status of Service:  Completed, signed off  If discussed at Long Length of Stay Meetings, dates discussed:    Additional Comments:  Jamison Yuhasz A, RN 11/12/2017, 12:05 PM

## 2017-11-12 NOTE — Discharge Instructions (Signed)
Resume diet and activity as before ° ° °

## 2017-11-12 NOTE — Progress Notes (Addendum)
MD order received to discharge pt home today with home health; Care Management previously established Home Health RN, PT and NT with [facility]; verbally reviewed AVS with pt, Rxs previously e-scribed to [facility]; no questions voiced at this time; pt discharged via wheelchair by nursing to the visitor's entrance

## 2017-11-14 ENCOUNTER — Ambulatory Visit: Payer: Medicare HMO | Admitting: Cardiovascular Disease

## 2017-11-15 NOTE — Discharge Summary (Signed)
[facility] - [place] [facility]   PATIENT NAME: Debra Bartlett    MR#:  [id number]  DATE OF BIRTH:  1925-01-17  DATE OF ADMISSION:  11/09/2017 ADMITTING PHYSICIAN: Shaune Pollack, MD  DATE OF DISCHARGE: 11/12/2017  4:00 PM  PRIMARY CARE PHYSICIAN: Jaclyn Shaggy, MD   ADMISSION DIAGNOSIS:  Lower urinary tract infectious disease [N39.0] Altered mental status, unspecified altered mental status type [R41.82]  DISCHARGE DIAGNOSIS:  Active Problems:   Acute metabolic encephalopathy   SECONDARY DIAGNOSIS:   Past Medical History:  Diagnosis Date  . Abdominal aortic aneurysm (AAA) (HCC) 2019  . Hypertension   . Hypothyroidism      ADMITTING HISTORY  HISTORY OF PRESENT ILLNESS:  Debra Bartlett  is a 84 y.o. female with a known history of AAA, hypertension and hypothyroidism.  The patient got AAA endovascular repair yesterday.  She was sent home with early today and was found confused.  It is said that the patient has been confused for a couple days before the procedure.  The patient was found UTI and treated with Rocephin IV.  CAT scan of the head is unremarkable.     HOSPITAL COURSE:   *Acute encephalopathy secondary to UTI *UTI *Abdominal aortic aneurysm with recent endovascular repair *Hypertension *Generalized weakness *Inpatient delirium over dementia  Patient was admitted to medical floor and started on IV fluids and IV antibiotics.  She slowly improved returning to baseline.  She does have cognitive impairment at her baseline and remained mildly confused through her hospital stay.  Physical therapy evaluate the patient.  Skilled nursing facility was thought to be beneficial at discharge but due to being private pay family chose to take the patient home.  Home health set up at home.  CONSULTS OBTAINED:    DRUG ALLERGIES:  No Known Allergies  DISCHARGE MEDICATIONS:   Allergies as of 11/12/2017   No Known Allergies     Medication List    TAKE these  medications   acetaminophen 325 MG tablet Commonly known as:  TYLENOL Take 325 mg by mouth every 6 (six) hours as needed for moderate pain or headache.   amLODipine 5 MG tablet Commonly known as:  NORVASC Take 1 tablet (5 mg total) by mouth daily.   aspirin EC 81 MG tablet Take 1 tablet (81 mg total) by mouth daily.   BILBERRY PO Take 1 tablet by mouth daily.   CALCIUM-VITAMIN D3 PO Take 1 tablet by mouth daily.   cephALEXin 250 MG capsule Commonly known as:  KEFLEX Take 1 capsule (250 mg total) by mouth 3 (three) times daily for 3 days.   diphenhydrAMINE 25 mg capsule Commonly known as:  BENADRYL Take 50 mg by mouth at bedtime.   levothyroxine 100 MCG tablet Commonly known as:  SYNTHROID, LEVOTHROID Take 100 mcg by mouth daily.   lisinopril 5 MG tablet Commonly known as:  PRINIVIL,ZESTRIL Take 5 mg by mouth daily.   Melatonin 10 MG Caps Take 10 mg by mouth at bedtime as needed (for sleep).   RA EAR DROPS HOMEOPATHIC OT Place 2 drops into both ears daily as needed (for stopped up ear).   ST JOHNS WORT PO Take 1 tablet by mouth daily.   VITAMIN B-12 PO Take 1 tablet by mouth daily.   VITAMIN C PO Take 1 tablet by mouth daily.   VITAMIN D3 PO Take 1 capsule by mouth daily.   VITAMIN E PO Take 1 capsule by mouth daily.  Today   VITAL SIGNS:  Blood pressure (!) 165/59, pulse 71, temperature 98.2 F (36.8 C), temperature source Oral, resp. rate 18, height 5\' 6"  (1.676 m), weight 65.3 kg (144 lb), SpO2 97 %.  I/O:  No intake or output data in the 24 hours ending 11/15/17 1453  PHYSICAL EXAMINATION:  Physical Exam  GENERAL:  84 y.o.-year-old patient lying in the bed with no acute distress.  LUNGS: Normal breath sounds bilaterally, no wheezing, rales,rhonchi or crepitation. No use of accessory muscles of respiration.  CARDIOVASCULAR: S1, S2 normal. No murmurs, rubs, or gallops.  ABDOMEN: Soft, non-tender, non-distended. Bowel sounds present. No  organomegaly or mass.  NEUROLOGIC: Moves all 4 extremities. PSYCHIATRIC: The patient is alert and awake SKIN: No obvious rash, lesion, or ulcer.   DATA REVIEW:   CBC Recent Labs  Lab 11/10/17 0425  WBC 12.4*  HGB 12.4  HCT 37.1  PLT 129*    Chemistries  Recent Labs  Lab 11/09/17 1233 11/10/17 0425  NA 138 137  K 3.9 3.6  CL 104 106  CO2 26 23  GLUCOSE 125* 110*  BUN 21* 16  CREATININE 1.14* 0.84  CALCIUM 9.6 8.6*  AST 44*  --   ALT 18  --   ALKPHOS 61  --   BILITOT 0.7  --     Cardiac Enzymes No results for input(s): TROPONINI in the last 168 hours.  Microbiology Results  Results for orders placed or performed during the hospital encounter of 11/09/17  Urine Culture     Status: None   Collection Time: 11/09/17 12:33 PM  Result Value Ref Range Status   Specimen Description   Final    URINE, RANDOM Performed at [facility], [address] [place], [place] [address]    Special Requests   Final    NONE Performed at [facility], [address] [place], [place] [address]    Culture   Final    NO GROWTH Performed at [facility], [address] [place], [place] [address]    Report Status 11/10/2017 FINAL  Final    RADIOLOGY:  No results found.  Follow up with PCP in 1 week.  Management plans discussed with the patient, family and they are in agreement.  CODE STATUS:  Code Status History    Date Active Date Inactive Code Status Order ID Comments User Context   11/09/2017 18:54 11/12/2017 19:46 Full Code [id number]  Shaune Pollackhen, Qing, MD Inpatient   11/08/2017 10:52 11/09/2017 12:04 Full Code [id number]  Annice Needyew, Jason S, MD Inpatient    Advance Directive Documentation     Most Recent Value  Type of Advance Directive  Living will  Pre-existing out of facility DNR order (yellow form or pink MOST form)  No data  "MOST" Form in Place?  No data      TOTAL TIME TAKING CARE OF THIS PATIENT ON DAY OF DISCHARGE: more than 30 minutes.    Orie FishermanSrikar R Xiana Carns M.D on 11/15/2017 at 2:53 PM  Between 7am to 6pm - Pager - [phone]  After 6pm go to [email or web address] - password [email or web address] [facility]  [facility]  Office  [phone]  CC: Primary care physician; Jaclyn Shaggyate, Denny C, MD  Note: This dictation was prepared with Dragon dictation along with smaller phrase technology. Any transcriptional errors that result from this process are unintentional.

## 2017-12-05 ENCOUNTER — Other Ambulatory Visit (INDEPENDENT_AMBULATORY_CARE_PROVIDER_SITE_OTHER): Payer: Self-pay | Admitting: Vascular Surgery

## 2017-12-05 DIAGNOSIS — I714 Abdominal aortic aneurysm, without rupture, unspecified: Secondary | ICD-10-CM

## 2017-12-07 ENCOUNTER — Encounter (INDEPENDENT_AMBULATORY_CARE_PROVIDER_SITE_OTHER): Payer: Self-pay | Admitting: Vascular Surgery

## 2017-12-07 ENCOUNTER — Ambulatory Visit (INDEPENDENT_AMBULATORY_CARE_PROVIDER_SITE_OTHER): Payer: Medicare HMO

## 2017-12-07 ENCOUNTER — Ambulatory Visit (INDEPENDENT_AMBULATORY_CARE_PROVIDER_SITE_OTHER): Payer: Medicare HMO | Admitting: Vascular Surgery

## 2017-12-07 VITALS — BP 205/87 | HR 72 | Resp 16 | Ht 62.0 in | Wt 135.0 lb

## 2017-12-07 DIAGNOSIS — I714 Abdominal aortic aneurysm, without rupture, unspecified: Secondary | ICD-10-CM

## 2017-12-07 DIAGNOSIS — I10 Essential (primary) hypertension: Secondary | ICD-10-CM

## 2017-12-07 NOTE — Progress Notes (Signed)
Subjective:    Patient ID: Debra Bartlett, female    DOB: 04/12/1925, 84 y.o.   MRN: [id number] Chief Complaint  Patient presents with  . Follow-up    4 week f/u   The patient presents for her first post procedure follow-up.  The patient is status post an endovascular repair of a abdominal aortic aneurysm on November 08, 2017.  The patient presents today with her husband.  The patient presents today without complaint.  The patient denies any abdominal pain, back pain or lower extremity pain.  The patient denies any issues with her groin incisions.  The patient underwent an abdominal aortic study which was notable for a AAA measuring 7.3 cm x 6.8 cm -this is stable when compared to the January 2019 CTA of the abdomen and pelvis.  Triphasic blood flow was noted to the bilateral common iliac arteries.  The patient denies any fever, nausea vomiting.   Review of Systems  Constitutional: Negative.   HENT: Negative.   Eyes: Negative.   Respiratory: Negative.   Cardiovascular: Negative.   Gastrointestinal: Negative.   Endocrine: Negative.   Genitourinary: Negative.   Musculoskeletal: Negative.   Skin: Negative.   Allergic/Immunologic: Negative.   Neurological: Negative.   Hematological: Negative.   Psychiatric/Behavioral: Negative.       Objective:   Physical Exam  Constitutional: She is oriented to person, place, and time. She appears well-developed and well-nourished. No distress.  HENT:  Head: Normocephalic and atraumatic.  Eyes: Conjunctivae are normal. Pupils are equal, round, and reactive to light.  Neck: Normal range of motion.  Cardiovascular: Normal rate, regular rhythm, normal heart sounds and intact distal pulses.  Pulses:      Radial pulses are 2+ on the right side, and 2+ on the left side.       Dorsalis pedis pulses are 2+ on the right side, and 2+ on the left side.       Posterior tibial pulses are 2+ on the right side, and 2+ on the left side.  Bilateral groin incisions  have healed  Pulmonary/Chest: Effort normal and breath sounds normal.  Abdominal: She exhibits no distension. There is no tenderness. There is no rebound.  Musculoskeletal: Normal range of motion. She exhibits no edema.  Neurological: She is alert and oriented to person, place, and time.  Skin: Skin is warm and dry. She is not diaphoretic.  Psychiatric: She has a normal mood and affect. Her behavior is normal. Judgment and thought content normal.  Vitals reviewed.  BP (!) 205/87 (BP Location: Right Arm, Patient Position: Sitting)   Pulse 72   Resp 16   Ht 5\' 2"  (1.575 m)   Wt 135 lb (61.2 kg)   BMI 24.69 kg/m   Past Medical History:  Diagnosis Date  . Abdominal aortic aneurysm (AAA) (HCC) 2019  . Hypertension   . Hypothyroidism    Social History   Socioeconomic History  . Marital status: Widowed    Spouse name: Not on file  . Number of children: Not on file  . Years of education: Not on file  . Highest education level: Not on file  Social Needs  . Financial resource strain: Not on file  . Food insecurity - worry: Not on file  . Food insecurity - inability: Not on file  . Transportation needs - medical: Not on file  . Transportation needs - non-medical: Not on file  Occupational History  . Not on file  Tobacco Use  . Smoking  status: Former Smoker    Types: Cigarettes  . Smokeless tobacco: Never Used  Substance and Sexual Activity  . Alcohol use: No    Frequency: Never  . Drug use: No  . Sexual activity: Not on file  Other Topics Concern  . Not on file  Social History Narrative  . Not on file   Past Surgical History:  Procedure Laterality Date  . ENDOVASCULAR REPAIR/STENT GRAFT N/A 11/08/2017   Procedure: ENDOVASCULAR REPAIR/STENT GRAFT;  Surgeon: Annice Needy, MD;  Location: [facility] INVASIVE CV LAB;  Service: Cardiovascular;  Laterality: N/A;  . NO PAST SURGERIES    . TONSILLECTOMY     Family History  Family history unknown: Yes   No Known Allergies       Assessment & Plan:  The patient presents for her first post procedure follow-up.  The patient is status post an endovascular repair of a abdominal aortic aneurysm on November 08, 2017.  The patient presents today with her husband.  The patient presents today without complaint.  The patient denies any abdominal pain, back pain or lower extremity pain.  The patient denies any issues with her groin incisions.  The patient underwent an abdominal aortic study which was notable for a AAA measuring 7.3 cm x 6.8 cm -this is stable when compared to the January 2019 CTA of the abdomen and pelvis.  Triphasic blood flow was noted to the bilateral common iliac arteries.  The patient denies any fever, nausea vomiting.  1. AAA (abdominal aortic aneurysm) without rupture (HCC) - Stable Studies reviewed with patient. Duplex stable with no leak, physical exam unremarkable. No surgery or intervention at this time. The patient to follow up in three months with an aortic duplex. The patient has a stable asymptomatic abdominal aortic aneurysm. I have reviewed the natural history of abdominal aortic aneurysms and the need for continued surveillance with ultrasound or CT scan is mandatory.  I have discussed with the patient at length the risk factors for and pathogenesis of atherosclerotic disease and encouraged a healthy diet, regular exercise regimen and blood pressure / glucose control. Patient was instructed to contact our office with problems in the interim such back pain, pulsatile abdominal masses or thrombosis in her extremities, extremity pain or development of ulcerations.    - VAS [place] EVAR DUPLEX; Future - VAS [place] ABI WITH/WO TBI; Future  2. Essential hypertension - Stable Encouraged good control as its slows the progression of atherosclerotic disease  Current Outpatient Medications on File Prior to Visit  Medication Sig Dispense Refill  . acetaminophen (TYLENOL) 325 MG tablet Take 325 mg by mouth every 6 (six)  hours as needed for moderate pain or headache.     Marland Kitchen amLODipine (NORVASC) 5 MG tablet Take 1 tablet (5 mg total) by mouth daily. 30 tablet 0  . Ascorbic Acid (VITAMIN C PO) Take 1 tablet by mouth daily.    . Bilberry, Vaccinium myrtillus, (BILBERRY PO) Take 1 tablet by mouth daily.    . Calcium Carbonate-Vitamin D (CALCIUM-VITAMIN D3 PO) Take 1 tablet by mouth daily.    . Cholecalciferol (VITAMIN D3 PO) Take 1 capsule by mouth daily.    . Cyanocobalamin (VITAMIN B-12 PO) Take 1 tablet by mouth daily.    . diphenhydrAMINE (BENADRYL) 25 mg capsule Take 50 mg by mouth at bedtime.    . Homeopathic Products (RA EAR DROPS HOMEOPATHIC OT) Place 2 drops into both ears daily as needed (for stopped up ear).    Marland Kitchen levothyroxine (  SYNTHROID, LEVOTHROID) 100 MCG tablet Take 100 mcg by mouth daily.     Marland Kitchen lisinopril (PRINIVIL,ZESTRIL) 5 MG tablet Take 5 mg by mouth daily.    . Melatonin 10 MG CAPS Take 10 mg by mouth at bedtime as needed (for sleep).    . ST JOHNS WORT PO Take 1 tablet by mouth daily.    Marland Kitchen VITAMIN E PO Take 1 capsule by mouth daily.    Marland Kitchen aspirin EC 81 MG tablet Take 1 tablet (81 mg total) by mouth daily. (Patient not taking: Reported on 12/07/2017) 150 tablet 2   No current facility-administered medications on file prior to visit.    There are no Patient Instructions on file for this visit. No Follow-up on file.  Safwan Tomei A Giana Castner, PA-C

## 2018-02-14 ENCOUNTER — Other Ambulatory Visit: Payer: Self-pay | Admitting: Internal Medicine

## 2018-02-14 DIAGNOSIS — R4182 Altered mental status, unspecified: Secondary | ICD-10-CM

## 2018-02-14 DIAGNOSIS — R0989 Other specified symptoms and signs involving the circulatory and respiratory systems: Secondary | ICD-10-CM

## 2018-02-21 ENCOUNTER — Ambulatory Visit
Admission: RE | Admit: 2018-02-21 | Discharge: 2018-02-21 | Disposition: A | Discharge status: Home / Self Care | Payer: Medicare HMO | Source: Ambulatory Visit | Attending: Internal Medicine | Admitting: Internal Medicine

## 2018-02-21 DIAGNOSIS — H748X2 Other specified disorders of left middle ear and mastoid: Secondary | ICD-10-CM | POA: Diagnosis not present

## 2018-02-21 DIAGNOSIS — R0989 Other specified symptoms and signs involving the circulatory and respiratory systems: Secondary | ICD-10-CM | POA: Diagnosis present

## 2018-02-21 DIAGNOSIS — I6523 Occlusion and stenosis of bilateral carotid arteries: Secondary | ICD-10-CM | POA: Insufficient documentation

## 2018-02-21 DIAGNOSIS — I6782 Cerebral ischemia: Secondary | ICD-10-CM | POA: Diagnosis not present

## 2018-02-21 DIAGNOSIS — G319 Degenerative disease of nervous system, unspecified: Secondary | ICD-10-CM | POA: Diagnosis not present

## 2018-02-21 DIAGNOSIS — R4182 Altered mental status, unspecified: Secondary | ICD-10-CM | POA: Diagnosis not present

## 2018-03-15 ENCOUNTER — Ambulatory Visit (INDEPENDENT_AMBULATORY_CARE_PROVIDER_SITE_OTHER): Payer: Medicare HMO | Admitting: Vascular Surgery

## 2018-03-15 ENCOUNTER — Other Ambulatory Visit (INDEPENDENT_AMBULATORY_CARE_PROVIDER_SITE_OTHER): Payer: Medicare HMO

## 2018-03-15 ENCOUNTER — Encounter (INDEPENDENT_AMBULATORY_CARE_PROVIDER_SITE_OTHER): Payer: Medicare HMO

## 2018-04-24 ENCOUNTER — Ambulatory Visit (INDEPENDENT_AMBULATORY_CARE_PROVIDER_SITE_OTHER): Payer: Medicare HMO | Admitting: Vascular Surgery

## 2018-04-24 ENCOUNTER — Other Ambulatory Visit (INDEPENDENT_AMBULATORY_CARE_PROVIDER_SITE_OTHER): Payer: Medicare HMO

## 2018-05-18 ENCOUNTER — Inpatient Hospital Stay
Admission: EM | Admit: 2018-05-18 | Discharge: 2018-05-23 | DRG: 281 | Disposition: A | Discharge status: Skilled Nursing Facility | Payer: Medicare HMO | Attending: Internal Medicine | Admitting: Internal Medicine

## 2018-05-18 ENCOUNTER — Other Ambulatory Visit: Payer: Self-pay

## 2018-05-18 ENCOUNTER — Emergency Department: Payer: Medicare HMO

## 2018-05-18 ENCOUNTER — Observation Stay: Payer: Medicare HMO

## 2018-05-18 ENCOUNTER — Encounter: Payer: Self-pay | Admitting: Emergency Medicine

## 2018-05-18 DIAGNOSIS — I10 Essential (primary) hypertension: Secondary | ICD-10-CM | POA: Diagnosis present

## 2018-05-18 DIAGNOSIS — Z7982 Long term (current) use of aspirin: Secondary | ICD-10-CM

## 2018-05-18 DIAGNOSIS — I272 Pulmonary hypertension, unspecified: Secondary | ICD-10-CM | POA: Diagnosis present

## 2018-05-18 DIAGNOSIS — Z95828 Presence of other vascular implants and grafts: Secondary | ICD-10-CM

## 2018-05-18 DIAGNOSIS — I639 Cerebral infarction, unspecified: Secondary | ICD-10-CM

## 2018-05-18 DIAGNOSIS — R29700 NIHSS score 0: Secondary | ICD-10-CM | POA: Diagnosis present

## 2018-05-18 DIAGNOSIS — I214 Non-ST elevation (NSTEMI) myocardial infarction: Principal | ICD-10-CM

## 2018-05-18 DIAGNOSIS — F0281 Dementia in other diseases classified elsewhere with behavioral disturbance: Secondary | ICD-10-CM | POA: Diagnosis present

## 2018-05-18 DIAGNOSIS — I714 Abdominal aortic aneurysm, without rupture: Secondary | ICD-10-CM | POA: Diagnosis present

## 2018-05-18 DIAGNOSIS — Z79890 Hormone replacement therapy (postmenopausal): Secondary | ICD-10-CM

## 2018-05-18 DIAGNOSIS — W19XXXA Unspecified fall, initial encounter: Secondary | ICD-10-CM

## 2018-05-18 DIAGNOSIS — G8194 Hemiplegia, unspecified affecting left nondominant side: Secondary | ICD-10-CM | POA: Diagnosis present

## 2018-05-18 DIAGNOSIS — I350 Nonrheumatic aortic (valve) stenosis: Secondary | ICD-10-CM | POA: Diagnosis present

## 2018-05-18 DIAGNOSIS — Z87891 Personal history of nicotine dependence: Secondary | ICD-10-CM

## 2018-05-18 DIAGNOSIS — G309 Alzheimer's disease, unspecified: Secondary | ICD-10-CM | POA: Diagnosis present

## 2018-05-18 DIAGNOSIS — E039 Hypothyroidism, unspecified: Secondary | ICD-10-CM | POA: Diagnosis present

## 2018-05-18 DIAGNOSIS — H9190 Unspecified hearing loss, unspecified ear: Secondary | ICD-10-CM | POA: Diagnosis present

## 2018-05-18 DIAGNOSIS — I161 Hypertensive emergency: Secondary | ICD-10-CM | POA: Diagnosis present

## 2018-05-18 DIAGNOSIS — Z79899 Other long term (current) drug therapy: Secondary | ICD-10-CM

## 2018-05-18 DIAGNOSIS — R29810 Facial weakness: Secondary | ICD-10-CM | POA: Diagnosis present

## 2018-05-18 HISTORY — DX: Nonrheumatic aortic (valve) stenosis: I35.0

## 2018-05-18 LAB — COMPREHENSIVE METABOLIC PANEL
ALBUMIN: 3.6 g/dL (ref 3.5–5.0)
ALT: 15 U/L (ref 0–44)
ANION GAP: 3 — AB (ref 5–15)
AST: 27 U/L (ref 15–41)
Alkaline Phosphatase: 67 U/L (ref 38–126)
BILIRUBIN TOTAL: 0.7 mg/dL (ref 0.3–1.2)
BUN: 24 mg/dL — AB (ref 8–23)
CHLORIDE: 110 mmol/L (ref 98–111)
CO2: 29 mmol/L (ref 22–32)
Calcium: 9.3 mg/dL (ref 8.9–10.3)
Creatinine, Ser: 0.82 mg/dL (ref 0.44–1.00)
GFR calc Af Amer: >60 mL/min (ref >60)
Glucose, Bld: 93 mg/dL (ref 70–99)
POTASSIUM: 4.3 mmol/L (ref 3.5–5.1)
Sodium: 142 mmol/L (ref 135–145)
TOTAL PROTEIN: 6.4 g/dL — AB (ref 6.5–8.1)

## 2018-05-18 LAB — CBC
HEMATOCRIT: 34.8 % — AB (ref 35.0–47.0)
Hemoglobin: 11.8 g/dL — ABNORMAL LOW (ref 12.0–16.0)
MCH: 29.7 pg (ref 26.0–34.0)
MCHC: 34.1 g/dL (ref 32.0–36.0)
MCV: 87.1 fL (ref 80.0–100.0)
PLATELETS: 114 10*3/uL — AB (ref 150–440)
RBC: 3.99 MIL/uL (ref 3.80–5.20)
RDW: 15.9 % — AB (ref 11.5–14.5)
WBC: 9.7 10*3/uL (ref 3.6–11.0)

## 2018-05-18 LAB — TROPONIN I
TROPONIN I: 0.26 ng/mL — AB (ref <0.03)
TROPONIN I: 0.28 ng/mL — AB (ref <0.03)

## 2018-05-18 LAB — URINALYSIS, ROUTINE W REFLEX MICROSCOPIC
BILIRUBIN URINE: NEGATIVE
Bacteria, UA: NONE SEEN
GLUCOSE, UA: NEGATIVE mg/dL
Hgb urine dipstick: NEGATIVE
KETONES UR: NEGATIVE mg/dL
NITRITE: NEGATIVE
PH: 6 (ref 5.0–8.0)
Protein, ur: NEGATIVE mg/dL
SPECIFIC GRAVITY, URINE: 1.024 (ref 1.005–1.030)

## 2018-05-18 LAB — APTT: aPTT: 29 seconds (ref 24–36)

## 2018-05-18 LAB — PROTIME-INR
INR: 1.01
Prothrombin Time: 13.2 seconds (ref 11.4–15.2)

## 2018-05-18 MED ORDER — SODIUM CHLORIDE 0.9% FLUSH: 3.0000 mL | INTRAVENOUS | Status: DC | PRN

## 2018-05-18 MED ORDER — SODIUM CHLORIDE 0.9 % IV SOLN: 250.0000 mL | INTRAVENOUS | Status: DC | PRN

## 2018-05-18 MED ORDER — METOPROLOL TARTRATE 25 MG PO TABS
12.5000 mg | ORAL_TABLET | Freq: Two times a day (BID) | ORAL | Status: DC
  Administered 2018-05-18 – 2018-05-20 (×3): 12.5 mg via ORAL
  Filled 2018-05-18 (×4): qty 1

## 2018-05-18 MED ORDER — LEVOTHYROXINE SODIUM 100 MCG PO TABS
100.0000 ug | ORAL_TABLET | Freq: Every day | ORAL | Status: DC
  Administered 2018-05-19 – 2018-05-23 (×4): 100 ug via ORAL
  Filled 2018-05-18 (×4): qty 1

## 2018-05-18 MED ORDER — ASPIRIN 81 MG PO CHEW
324.0000 mg | CHEWABLE_TABLET | ORAL | Status: AC
  Administered 2018-05-18: 324 mg via ORAL

## 2018-05-18 MED ORDER — LISINOPRIL 5 MG PO TABS
5.0000 mg | ORAL_TABLET | Freq: Every day | ORAL | Status: DC
  Administered 2018-05-19 – 2018-05-20 (×2): 5 mg via ORAL
  Filled 2018-05-18 (×2): qty 1

## 2018-05-18 MED ORDER — ASPIRIN EC 81 MG PO TBEC
81.0000 mg | DELAYED_RELEASE_TABLET | Freq: Every day | ORAL | Status: DC
  Administered 2018-05-19 – 2018-05-23 (×4): 81 mg via ORAL
  Filled 2018-05-18 (×4): qty 1

## 2018-05-18 MED ORDER — ACETAMINOPHEN 325 MG PO TABS: 650.0000 mg | ORAL_TABLET | ORAL | Status: DC | PRN

## 2018-05-18 MED ORDER — ONDANSETRON HCL 4 MG/2ML IJ SOLN
4.0000 mg | Freq: Four times a day (QID) | INTRAMUSCULAR | Status: DC | PRN
Start: 2018-05-18 — End: 2018-05-23

## 2018-05-18 MED ORDER — CALCIUM CARBONATE-VITAMIN D 500-200 MG-UNIT PO TABS
1.0000 | ORAL_TABLET | Freq: Every day | ORAL | Status: DC
  Administered 2018-05-19 – 2018-05-23 (×3): 1 via ORAL
  Filled 2018-05-18 (×4): qty 1

## 2018-05-18 MED ORDER — ASPIRIN 81 MG PO CHEW
CHEWABLE_TABLET | ORAL | Status: AC
  Filled 2018-05-18: qty 4

## 2018-05-18 MED ORDER — VITAMIN E 45 MG (100 UNIT) PO CAPS
100.0000 [IU] | ORAL_CAPSULE | Freq: Every day | ORAL | Status: DC
  Administered 2018-05-19 – 2018-05-23 (×4): 100 [IU] via ORAL
  Filled 2018-05-18 (×5): qty 1

## 2018-05-18 MED ORDER — MELATONIN 5 MG PO TABS
10.0000 mg | ORAL_TABLET | Freq: Every evening | ORAL | Status: DC | PRN
  Administered 2018-05-18 – 2018-05-22 (×3): 10 mg via ORAL
  Filled 2018-05-18 (×5): qty 2

## 2018-05-18 MED ORDER — HEPARIN BOLUS VIA INFUSION
2000.0000 [IU] | Freq: Once | INTRAVENOUS | Status: AC
Start: 2018-05-18 — End: 2018-05-18
  Administered 2018-05-18: 2000 [IU] via INTRAVENOUS
  Filled 2018-05-18: qty 2000

## 2018-05-18 MED ORDER — ASPIRIN 300 MG RE SUPP: 300.0000 mg | RECTAL | Status: AC

## 2018-05-18 MED ORDER — ACETAMINOPHEN 325 MG PO TABS
325.0000 mg | ORAL_TABLET | Freq: Four times a day (QID) | ORAL | Status: DC | PRN
Start: 2018-05-18 — End: 2018-05-23
  Administered 2018-05-21 – 2018-05-22 (×3): 325 mg via ORAL
  Filled 2018-05-18 (×4): qty 1

## 2018-05-18 MED ORDER — NITROGLYCERIN 0.4 MG SL SUBL: 0.4000 mg | SUBLINGUAL_TABLET | SUBLINGUAL | Status: DC | PRN

## 2018-05-18 MED ORDER — VITAMIN C 500 MG PO TABS
500.0000 mg | ORAL_TABLET | Freq: Every day | ORAL | Status: DC
  Administered 2018-05-19 – 2018-05-23 (×3): 500 mg via ORAL
  Filled 2018-05-18 (×5): qty 1

## 2018-05-18 MED ORDER — VITAMIN B-12 100 MCG PO TABS
50.0000 ug | ORAL_TABLET | Freq: Every day | ORAL | Status: DC
  Administered 2018-05-19 – 2018-05-23 (×3): 50 ug via ORAL
  Filled 2018-05-18 (×5): qty 1

## 2018-05-18 MED ORDER — ATORVASTATIN CALCIUM 20 MG PO TABS
40.0000 mg | ORAL_TABLET | Freq: Every day | ORAL | Status: DC
  Administered 2018-05-19 – 2018-05-22 (×4): 40 mg via ORAL
  Filled 2018-05-18 (×4): qty 2

## 2018-05-18 MED ORDER — CALCIUM-VITAMIN D3 500-400 MG-UNIT PO TABS: ORAL_TABLET | Freq: Every day | ORAL | Status: DC

## 2018-05-18 MED ORDER — HEPARIN (PORCINE) IN NACL 100-0.45 UNIT/ML-% IJ SOLN
750.0000 [IU]/h | INTRAMUSCULAR | Status: DC
  Administered 2018-05-18: 750 [IU]/h via INTRAVENOUS
  Filled 2018-05-18: qty 250

## 2018-05-18 MED ORDER — SODIUM CHLORIDE 0.9% FLUSH
3.0000 mL | Freq: Two times a day (BID) | INTRAVENOUS | Status: DC
  Administered 2018-05-18 – 2018-05-22 (×4): 3 mL via INTRAVENOUS

## 2018-05-18 NOTE — Progress Notes (Signed)
ANTICOAGULATION CONSULT NOTE Pharmacy Consult for Heparin drip Indication: chest pain/ACS  No Known Allergies  Patient Measurements: Height: 5\' 6"  (167.6 cm) Weight: 145 lb (65.8 kg) IBW/kg (Calculated) : 59.3 Heparin Dosing Weight: 66 kg  Vital Signs: Temp: 97.9 F (36.6 C) (08/16 1553) Temp Source: Oral (08/16 1553) BP: 194/56 (08/16 1553) Pulse Rate: 69 (08/16 1553)  Labs: Recent Labs    05/18/18 1558  HGB 11.8*  HCT 34.8*  PLT 114*  CREATININE 0.82  TROPONINI 0.26*    Estimated Creatinine Clearance: 41.8 mL/min (by C-G formula based on SCr of 0.82 mg/dL).   84 y/o F admitted for facial droop that resolved completely prior to admission to ED via EMS. Patient found to have elevated troponin ordered heparin drip for NSTEMI. No h/o anticoagulation. NIH stroke scale= 0  Goal of Therapy:  Heparin level 0.3-0.7 units/ml Monitor platelets by anticoagulation protocol: Yes   Plan:  Give 2000 units bolus x 1 Start heparin infusion at 750 units/hr Check anti-Xa level in 8 hours and daily while on heparin Continue to monitor H&H and platelets  Luisa HartChristy, Jabarie Pop D 05/18/2018,5:35 PM

## 2018-05-18 NOTE — ED Notes (Signed)
Debra Bartlett, ed tech sitting with pt for safety.

## 2018-05-18 NOTE — ED Notes (Signed)
Pt got up to throw out trash and got twisted in cords and IV fell back and hit her head against the sliding door. No cut or bleeding. Assisted back to bed and fall risk braclet applied

## 2018-05-18 NOTE — ED Notes (Signed)
Patient transported to CT 

## 2018-05-18 NOTE — H&P (Signed)
[facility] [place] at [facility]   PATIENT NAME: Debra Bartlett    MR#:  [id number]  DATE OF BIRTH:  05/21/1925  DATE OF ADMISSION:  05/18/2018  PRIMARY CARE PHYSICIAN: Jaclyn Shaggyate, Denny C, MD   REQUESTING/REFERRING PHYSICIAN:   CHIEF COMPLAINT:   Chief Complaint  Patient presents with  . Weakness    HISTORY OF PRESENT ILLNESS: Debra Ghazimma Stair  is a 84 y.o. female with a known history of hypertension, hypothyroidism, abdominal aortic aneurysm was brought to the emergency room by EMS.  Patient was noticed to be leaning to the left according to the boyfriend.  Patient was worked up for CVA in the emergency room but CT head was negative for any acute abnormality.  She does not have any facial droop in the emergency room.  During the work-up in the emergency room her troponin has been elevated.  Patient does not complain of any chest pain.  She was started on heparin drip for anticoagulation in the emergency room.  No tingling or numbness in any part of the body.  No weakness in any part of the body.  Patient is hard of hearing.  PAST MEDICAL HISTORY:   Past Medical History:  Diagnosis Date  . Abdominal aortic aneurysm (AAA) (HCC) 2019  . Hypertension   . Hypothyroidism     PAST SURGICAL HISTORY:  Past Surgical History:  Procedure Laterality Date  . ENDOVASCULAR REPAIR/STENT GRAFT N/A 11/08/2017   Procedure: ENDOVASCULAR REPAIR/STENT GRAFT;  Surgeon: Annice Needyew, Jason S, MD;  Location: [facility] INVASIVE CV LAB;  Service: Cardiovascular;  Laterality: N/A;  . NO PAST SURGERIES    . TONSILLECTOMY      SOCIAL HISTORY:  Social History   Tobacco Use  . Smoking status: Former Smoker    Types: Cigarettes  . Smokeless tobacco: Never Used  Substance Use Topics  . Alcohol use: No    Frequency: Never    FAMILY HISTORY:  Family History  Family history unknown: Yes    DRUG ALLERGIES: No Known Allergies  REVIEW OF SYSTEMS:   CONSTITUTIONAL: No fever, fatigue or weakness.  EYES:  No blurred or double vision.  EARS, NOSE, AND THROAT: No tinnitus or ear pain.  RESPIRATORY: No cough, shortness of breath, wheezing or hemoptysis.  CARDIOVASCULAR: No chest pain, orthopnea, edema.  GASTROINTESTINAL: No nausea, vomiting, diarrhea or abdominal pain.  GENITOURINARY: No dysuria, hematuria.  ENDOCRINE: No polyuria, nocturia,  HEMATOLOGY: No anemia, easy bruising or bleeding SKIN: No rash or lesion. MUSCULOSKELETAL: No joint pain or arthritis.   NEUROLOGIC: No tingling, numbness, weakness.  PSYCHIATRY: No anxiety or depression.   MEDICATIONS AT HOME:  Prior to Admission medications   Medication Sig Start Date End Date Taking? Authorizing Provider  amLODipine (NORVASC) 5 MG tablet Take 1 tablet (5 mg total) by mouth daily. 11/13/17  Yes Sudini, Wardell HeathSrikar, MD  Ascorbic Acid (VITAMIN C PO) Take 1 tablet by mouth daily.   Yes [provider]  Bilberry, Vaccinium myrtillus, (BILBERRY PO) Take 1 tablet by mouth daily.   Yes [provider]  Calcium Carbonate-Vitamin D (CALCIUM-VITAMIN D3 PO) Take 1 tablet by mouth daily.   Yes [provider]  Cholecalciferol (VITAMIN D3 PO) Take 1 capsule by mouth daily.   Yes [provider]  Cyanocobalamin (VITAMIN B-12 PO) Take 1 tablet by mouth daily.   Yes [provider]  Homeopathic Products (RA EAR DROPS HOMEOPATHIC OT) Place 2 drops into both ears daily as needed (for stopped up ear).  Yes [provider]  levothyroxine (SYNTHROID, LEVOTHROID) 100 MCG tablet Take 100 mcg by mouth daily.  05/12/17  Yes [provider]  lisinopril (PRINIVIL,ZESTRIL) 5 MG tablet Take 5 mg by mouth daily.   Yes [provider]  Melatonin 10 MG CAPS Take 10 mg by mouth at bedtime as needed (for sleep).   Yes [provider]  ST JOHNS WORT PO Take 1 tablet by mouth daily.   Yes [provider]  VITAMIN E PO Take 1 capsule by mouth daily.   Yes [provider]   acetaminophen (TYLENOL) 325 MG tablet Take 325 mg by mouth every 6 (six) hours as needed for moderate pain or headache.     [provider]  aspirin EC 81 MG tablet Take 1 tablet (81 mg total) by mouth daily. Patient not taking: Reported on 12/07/2017 11/09/17   Annice Needy, MD      PHYSICAL EXAMINATION:   VITAL SIGNS: Blood pressure (!) 194/56, pulse 69, temperature 97.9 F (36.6 C), temperature source Oral, resp. rate 17, height 5\' 6"  (1.676 m), weight 65.8 kg, SpO2 97 %.  GENERAL:  84 y.o.-year-old patient lying in the bed with no acute distress.  EYES: Pupils equal, round, reactive to light and accommodation. No scleral icterus. Extraocular muscles intact.  HEENT: Head atraumatic, normocephalic. Oropharynx and nasopharynx clear.  NECK:  Supple, no jugular venous distention. No thyroid enlargement, no tenderness.  LUNGS: Normal breath sounds bilaterally, no wheezing, rales,rhonchi or crepitation. No use of accessory muscles of respiration.  CARDIOVASCULAR: S1, S2 normal. No murmurs, rubs, or gallops.  ABDOMEN: Soft, nontender, nondistended. Bowel sounds present. No organomegaly or mass.  EXTREMITIES: No pedal edema, cyanosis, or clubbing.  NEUROLOGIC: Cranial nerves II through XII are intact. Muscle strength 5/5 in all extremities. Sensation intact. Gait not checked.  PSYCHIATRIC: The patient is alert and oriented x 3.  SKIN: No obvious rash, lesion, or ulcer.   LABORATORY PANEL:   CBC Recent Labs  Lab 05/18/18 1558  WBC 9.7  HGB 11.8*  HCT 34.8*  PLT 114*  MCV 87.1  MCH 29.7  MCHC 34.1  RDW 15.9*   ------------------------------------------------------------------------------------------------------------------  Chemistries  Recent Labs  Lab 05/18/18 1558  NA 142  K 4.3  CL 110  CO2 29  GLUCOSE 93  BUN 24*  CREATININE 0.82  CALCIUM 9.3  AST 27  ALT 15  ALKPHOS 67  BILITOT 0.7    ------------------------------------------------------------------------------------------------------------------ estimated creatinine clearance is 41.8 mL/min (by C-G formula based on SCr of 0.82 mg/dL). ------------------------------------------------------------------------------------------------------------------ No results for input(s): TSH, T4TOTAL, T3FREE, THYROIDAB in the last 72 hours.  Invalid input(s): FREET3   Coagulation profile No results for input(s): INR, PROTIME in the last 168 hours. ------------------------------------------------------------------------------------------------------------------- No results for input(s): DDIMER in the last 72 hours. -------------------------------------------------------------------------------------------------------------------  Cardiac Enzymes Recent Labs  Lab 05/18/18 1558  TROPONINI 0.26*   ------------------------------------------------------------------------------------------------------------------ Invalid input(s): POCBNP  ---------------------------------------------------------------------------------------------------------------  Urinalysis    Component Value Date/Time   COLORURINE AMBER (A) 05/18/2018 1635   APPEARANCEUR HAZY (A) 05/18/2018 1635   LABSPEC 1.024 05/18/2018 1635   PHURINE 6.0 05/18/2018 1635   GLUCOSEU NEGATIVE 05/18/2018 1635   HGBUR NEGATIVE 05/18/2018 1635   BILIRUBINUR NEGATIVE 05/18/2018 1635   KETONESUR NEGATIVE 05/18/2018 1635   PROTEINUR NEGATIVE 05/18/2018 1635   NITRITE NEGATIVE 05/18/2018 1635   LEUKOCYTESUR SMALL (A) 05/18/2018 1635     RADIOLOGY: Ct Head Wo Contrast  Result Date: 05/18/2018 CLINICAL DATA:  Facial weakness. Pt comes into the  ED via [facility] from a gas station where they found the patient to be leaning to the left with a left facial droop. Patient denies any pain or symptoms at this time. EXAM: CT HEAD WITHOUT CONTRAST TECHNIQUE: Contiguous axial images were  obtained from the base of the skull through the vertex without intravenous contrast. COMPARISON:  02/21/2018 and older studies. FINDINGS: Brain: No evidence of acute infarction, hemorrhage, hydrocephalus, extra-axial collection or mass lesion/mass effect. There is ventricular and sulcal enlargement reflecting moderate generalized atrophy stable from prior study. Patchy areas of white matter hypoattenuation are also noted consistent with moderate chronic microvascular ischemic change, also stable. Vascular: No hyperdense vessel or unexpected calcification. Skull: Normal. Negative for fracture or focal lesion. Sinuses/Orbits: Visualize globes and orbits are unremarkable. Dependent secretions in the left sphenoid sinus. Remaining visualized sinuses are clear. Other: None. IMPRESSION: 1. No acute intracranial abnormalities. 2. Atrophy and chronic microvascular ischemic change stable from the prior study. Electronically Signed   By: Amie Portlandavid  Ormond M.D.   On: 05/18/2018 16:09    EKG: Orders placed or performed during the hospital encounter of 05/18/18  . ED EKG  . ED EKG  . EKG 12-Lead  . EKG 12-Lead    IMPRESSION AND PLAN:  84 year old female patient with history of abdominal aortic on exam, hypertension, hypothyroidism presented to the emergency room for further evaluation of facial droop which has completely resolved  -Abnormal troponin No complaints of any chest pain EKG shows no ST segment elevation Cycle troponin to rule out ischemia Check echocardiogram and cardiology consultation Continue anticoagulation with heparin  -Questionable facial droop  Monitor for any stroke symptoms Initial CT head no acute abnormality Carotid ultrasound done in May 2019 did not show any significant obstruction  -Hypertension Oral metoprolol to control blood pressure Continue ACE inhibitor  -Hypothyroidism Resume Synthroid supplements  -DVT prophylaxis On heparin  All the records are reviewed and case  discussed with ED provider. Management plans discussed with the patient, family and they are in agreement.  CODE STATUS:Full code Code Status History    Date Active Date Inactive Code Status Order ID Comments User Context   11/09/2017 1854 11/12/2017 1946 Full Code [id number]  Shaune Pollackhen, Qing, MD Inpatient   11/08/2017 1052 11/09/2017 1204 Full Code [id number]  Annice Needyew, Jason S, MD Inpatient       TOTAL TIME TAKING CARE OF THIS PATIENT: 52 minutes.    Ihor AustinPavan Madisun Hargrove M.D on 05/18/2018 at 6:23 PM  Between 7am to 6pm - Pager - [phone]  After 6pm go to [email or web address] - password [email or web address] [facility]  [facility]  Office  [phone]  CC: Primary care physician; Jaclyn Shaggyate, Denny C, MD

## 2018-05-18 NOTE — ED Notes (Addendum)
Date and time results received: 05/18/18  (use smartphrase ".now" to insert current time)  Test: trop Critical Value: 0.26  Name of Provider Notified: Dr Lenard LancePaduchowski  Orders Received? Or Actions Taken?: EDP notified

## 2018-05-18 NOTE — ED Notes (Signed)
Spoke to Daughter in Advice workerlaw Trish [phone]. Pt states not to tell her any information. DIL states pt is mean and crazy and will not cooperate with plan of care. Pt gave permission to call DIL and notify her of room assignment

## 2018-05-18 NOTE — ED Triage Notes (Signed)
Pt comes into the ED via [facility] from a gas station where they found the patient o be leaning to the left with a left facial droop.  Patient denies any pain or symptoms at this time.  NIH negative with EMS minus left foot weakness.   CBG 95.

## 2018-05-18 NOTE — ED Notes (Signed)
Jenn RN, aware of bed assigned  

## 2018-05-18 NOTE — ED Provider Notes (Addendum)
[facility] Emergency Department Provider Note  Time seen: 3:51 PM  I have reviewed the triage vital signs and the nursing notes.   HISTORY  Chief Complaint No chief complaint on file.    HPI Debra Bartlett is a 84 y.o. female with a past medical history of an abdominal aortic aneurysm, hypertension, hypothyroidism, presents to the emergency department for left facial droop and leaning to the left.  According to EMS the patient was with her boyfriend driving when he pulled over because he noticed that she was leaning to the left.  He also thought that she had a left facial droop.  They called EMS.  Per EMS fire department arrived first and did not fact see a left facial droop as well.  However EMS states when they arrived the facial droop had resolved and the patient was awake alert oriented x4 with no complaints initially did not want to come to the hospital for evaluation but ultimately was agreeable.  Here the patient appears well, remains alert and oriented x4, no complaints, no distress.  Awaiting the patient's boyfriend further history.  Patient states she feels well with a largely negative review of systems.   Past Medical History:  Diagnosis Date  . Abdominal aortic aneurysm (AAA) (HCC) 2019  . Hypertension   . Hypothyroidism     Patient Active Problem List   Diagnosis Date Noted  . Acute metabolic encephalopathy 11/09/2017  . Hypertension 09/19/2017  . AAA (abdominal aortic aneurysm) without rupture (HCC) 09/19/2017    Past Surgical History:  Procedure Laterality Date  . ENDOVASCULAR REPAIR/STENT GRAFT N/A 11/08/2017   Procedure: ENDOVASCULAR REPAIR/STENT GRAFT;  Surgeon: Annice Needyew, Jason S, MD;  Location: [facility] INVASIVE CV LAB;  Service: Cardiovascular;  Laterality: N/A;  . NO PAST SURGERIES    . TONSILLECTOMY      Prior to Admission medications   Medication Sig Start Date End Date Taking? Authorizing Provider  acetaminophen (TYLENOL) 325 MG tablet Take  325 mg by mouth every 6 (six) hours as needed for moderate pain or headache.     [provider]  amLODipine (NORVASC) 5 MG tablet Take 1 tablet (5 mg total) by mouth daily. 11/13/17   Milagros LollSudini, Srikar, MD  Ascorbic Acid (VITAMIN C PO) Take 1 tablet by mouth daily.    [provider]  aspirin EC 81 MG tablet Take 1 tablet (81 mg total) by mouth daily. Patient not taking: Reported on 12/07/2017 11/09/17   Annice Needyew, Jason S, MD  Bilberry, Vaccinium myrtillus, (BILBERRY PO) Take 1 tablet by mouth daily.    [provider]  Calcium Carbonate-Vitamin D (CALCIUM-VITAMIN D3 PO) Take 1 tablet by mouth daily.    [provider]  Cholecalciferol (VITAMIN D3 PO) Take 1 capsule by mouth daily.    [provider]  Cyanocobalamin (VITAMIN B-12 PO) Take 1 tablet by mouth daily.    [provider]  diphenhydrAMINE (BENADRYL) 25 mg capsule Take 50 mg by mouth at bedtime.    [provider]  Homeopathic Products (RA EAR DROPS HOMEOPATHIC OT) Place 2 drops into both ears daily as needed (for stopped up ear).    [provider]  levothyroxine (SYNTHROID, LEVOTHROID) 100 MCG tablet Take 100 mcg by mouth daily.  05/12/17   [provider]  lisinopril (PRINIVIL,ZESTRIL) 5 MG tablet Take 5 mg by mouth daily.    [provider]  Melatonin 10 MG CAPS Take 10 mg by mouth at bedtime as needed (for sleep).  [provider]  ST JOHNS WORT PO Take 1 tablet by mouth daily.    [provider]  VITAMIN E PO Take 1 capsule by mouth daily.    [provider]    No Known Allergies  Family History  Family history unknown: Yes    Social History Social History   Tobacco Use  . Smoking status: Former Smoker    Types: Cigarettes  . Smokeless tobacco: Never Used  Substance Use Topics  . Alcohol use: No    Frequency: Never  . Drug use: No    Review of Systems Constitutional: Negative for fever. Cardiovascular:  Negative for chest pain. Respiratory: Negative for shortness of breath. Gastrointestinal: Negative for abdominal pain Genitourinary: Negative for urinary compaints Musculoskeletal: Negative for musculoskeletal complaints Skin: Negative for skin complaints  Neurological: Negative for headache.  Reported left facial droop which is since resolved. All other ROS negative  ____________________________________________   PHYSICAL EXAM:  Constitutional: Alert and oriented. Well appearing and in no distress. Eyes: Normal exam ENT   Head: Normocephalic and atraumatic.   Mouth/Throat: Mucous membranes are moist. Cardiovascular: Normal rate, regular rhythm.  Respiratory: Normal respiratory effort without tachypnea nor retractions. Breath sounds are clear  Gastrointestinal: Soft and nontender. No distention.   Musculoskeletal: Nontender with normal range of motion in all extremities. Neurologic:  Normal speech and language. No gross focal neurologic deficits.  Cranial nerves intact.  Equal grip strength.  No pronator drift.  5/5 motor in all extremities.  Able to ambulate to the bed without difficulty. Skin:  Skin is warm, dry and intact.  Psychiatric: Mood and affect are normal. Speech and behavior are normal.   ____________________________________________    EKG  EKG reviewed and interpreted by myself shows normal sinus rhythm at 65 bpm with a widened QRS, normal axis, largely normal intervals with no concerning ST changes.  ____________________________________________    RADIOLOGY  CT scan of the head is negative  ____________________________________________   INITIAL IMPRESSION / ASSESSMENT AND PLAN / ED COURSE  Pertinent labs & imaging results that were available during my care of the patient were reviewed by me and considered in my medical decision making (see chart for details).  Patient presents to the emergency department for left facial droop and possible  left-sided weakness starting approximately 30 to 45 minutes ago, now completely resolved.  Patient has no symptoms at this time with a normal neurological examination NIH stroke scale of 0 currently.  Differential would include TIA, CVA, metabolic or electrolyte abnormality, infectious etiology such as urinary tract infection.  We will check labs, CT scan of the head, EKG and continue to closely monitor.  Patient agreeable to plan of care.  Patient's labs are resulted showing a troponin elevated 0.26 most consistent with NSTEMI.  CT scan of the head is negative.  Patient is back to her baseline and continues to have an NIH stroke scale of 0.  I reviewed the patient's records there are no old troponins for comparison.  We will start the patient on a heparin infusion and admit to the hospital service for NSTEMI   CRITICAL CARE Performed by: Minna Antis   Total critical care time: 30 minutes  Critical care time was exclusive of separately billable procedures and treating other patients.  Critical care was necessary to treat or prevent imminent or life-threatening deterioration.  Critical care was time spent personally by me on the following activities: development of treatment plan with patient and/or surrogate as well as  nursing, discussions with consultants, evaluation of patient's response to treatment, examination of patient, obtaining history from patient or surrogate, ordering and performing treatments and interventions, ordering and review of laboratory studies, ordering and review of radiographic studies, pulse oximetry and re-evaluation of patient's condition.   ----------------------------------------- 7:09 PM on 05/18/2018 -----------------------------------------  Patient fell onto the ground and hit the left side of her head on the window while trying to throw her trash away.  Patient has been able to ambulate without issue, no extremity pain.  Is complaining of a left-sided  headache/head pain.  We will repeat a CT scan of the head.  Repeat CT largely unchanged.   ____________________________________________   FINAL CLINICAL IMPRESSION(S) / ED DIAGNOSES  Weakness, resolved TIA NSTEMI   Minna AntisPaduchowski, Henryk Ursin, MD 05/18/18 1708    Minna AntisPaduchowski, Yamel Bale, MD 05/18/18 2318

## 2018-05-19 ENCOUNTER — Observation Stay
Admit: 2018-05-19 | Discharge: 2018-05-19 | Disposition: A | Discharge status: Home / Self Care | Payer: Medicare HMO | Attending: Internal Medicine | Admitting: Internal Medicine

## 2018-05-19 ENCOUNTER — Observation Stay: Payer: Medicare HMO

## 2018-05-19 ENCOUNTER — Encounter: Payer: Self-pay | Admitting: Cardiology

## 2018-05-19 DIAGNOSIS — I214 Non-ST elevation (NSTEMI) myocardial infarction: Principal | ICD-10-CM

## 2018-05-19 LAB — LIPID PANEL
CHOLESTEROL: 154 mg/dL (ref 0–200)
HDL: 49 mg/dL (ref >40)
LDL Cholesterol: 93 mg/dL (ref 0–99)
TRIGLYCERIDES: 62 mg/dL (ref <150)
Total CHOL/HDL Ratio: 3.1 RATIO
VLDL: 12 mg/dL (ref 0–40)

## 2018-05-19 LAB — CBC
HCT: 35.1 % (ref 35.0–47.0)
Hemoglobin: 11.8 g/dL — ABNORMAL LOW (ref 12.0–16.0)
MCH: 29.6 pg (ref 26.0–34.0)
MCHC: 33.6 g/dL (ref 32.0–36.0)
MCV: 88.3 fL (ref 80.0–100.0)
Platelets: 95 10*3/uL — ABNORMAL LOW (ref 150–440)
RBC: 3.98 MIL/uL (ref 3.80–5.20)
RDW: 16 % — AB (ref 11.5–14.5)
WBC: 8.4 10*3/uL (ref 3.6–11.0)

## 2018-05-19 LAB — URINE CULTURE: Culture: NO GROWTH

## 2018-05-19 LAB — ECHOCARDIOGRAM COMPLETE
Height: 66 in
Weight: 2012.8 oz

## 2018-05-19 LAB — BASIC METABOLIC PANEL
ANION GAP: 6 (ref 5–15)
BUN: 22 mg/dL (ref 8–23)
CO2: 27 mmol/L (ref 22–32)
Calcium: 9.2 mg/dL (ref 8.9–10.3)
Chloride: 110 mmol/L (ref 98–111)
Creatinine, Ser: 0.66 mg/dL (ref 0.44–1.00)
GFR calc Af Amer: >60 mL/min (ref >60)
GLUCOSE: 96 mg/dL (ref 70–99)
POTASSIUM: 3.9 mmol/L (ref 3.5–5.1)
Sodium: 143 mmol/L (ref 135–145)

## 2018-05-19 LAB — TROPONIN I
TROPONIN I: 0.27 ng/mL — AB (ref <0.03)
TROPONIN I: 0.28 ng/mL — AB (ref <0.03)

## 2018-05-19 LAB — HEPARIN LEVEL (UNFRACTIONATED)
HEPARIN UNFRACTIONATED: 0.31 [IU]/mL (ref 0.30–0.70)
HEPARIN UNFRACTIONATED: 0.37 [IU]/mL (ref 0.30–0.70)

## 2018-05-19 MED ORDER — HALOPERIDOL LACTATE 5 MG/ML IJ SOLN
1.0000 mg | Freq: Three times a day (TID) | INTRAMUSCULAR | Status: DC | PRN
  Administered 2018-05-19 – 2018-05-22 (×2): 1 mg via INTRAVENOUS
  Filled 2018-05-19 (×3): qty 1

## 2018-05-19 MED ORDER — HYDRALAZINE HCL 20 MG/ML IJ SOLN
10.0000 mg | Freq: Four times a day (QID) | INTRAMUSCULAR | Status: DC | PRN
  Administered 2018-05-19 – 2018-05-22 (×4): 10 mg via INTRAVENOUS
  Filled 2018-05-19 (×4): qty 1

## 2018-05-19 MED ORDER — HALOPERIDOL LACTATE 5 MG/ML IJ SOLN: 1.0000 mg | Freq: Three times a day (TID) | INTRAMUSCULAR | Status: DC | PRN

## 2018-05-19 MED ORDER — HALOPERIDOL 0.5 MG PO TABS
0.5000 mg | ORAL_TABLET | Freq: Three times a day (TID) | ORAL | Status: DC | PRN
  Filled 2018-05-19: qty 1

## 2018-05-19 NOTE — Progress Notes (Signed)
Spoke with patient daughter-in-law at bedside, mentioned that patient's son is healthcare power of attorney, he also needs help with hospital chaplain to make sure all the paperwork is ready because family is requesting placement.

## 2018-05-19 NOTE — Consult Note (Signed)
CARDIOLOGY CONSULT NOTE  Patient ID: Debra Bartlett MRN: [id number] DOB/AGE: 82/02/1925 82 y.o.  Admit date: 05/18/2018 Primary Physician Jaclyn Shaggyate, Denny C, MD Primary Cardiologist Lorine BearsMuhammad Arida, MD Chief Complaint  Elevated troponine Requesting  Dr. Tobi BastosPyreddy  HPI:   The patient was admitted with facial droop and apparent new weakness.  This was transient and reported by her companion and resolved by the time EMS got to the scene.   In the ED CT of the head demonstrated no acute changes.  .  She was noted to have an elevated troponin.  She was put on heparin but this has been discontinued because of confusion and the fact that she has been taking out her own IVs.  We are called because of the increased troponin.    The patient is unable to give any history or details.  I do not, through review of her chart find any history of CAD.  However, she saw Dr Kirke CorinArida earlier this year prior to consideration of repair of her AAA.  She was noted to have a murmur with echo showing moderate to severe AS.     She is very confused.  She can give no details about the event.  She denies any symptoms.  She says that her most exerting activity is to dance.  The patient denies any new symptoms such as chest discomfort, neck or arm discomfort. There has been no new shortness of breath, PND or orthopnea. There have been no reported palpitations, presyncope or syncope.     Past Medical History:  Diagnosis Date  . Abdominal aortic aneurysm (AAA) (HCC) 2019  . Hypertension   . Hypothyroidism     Past Surgical History:  Procedure Laterality Date  . ENDOVASCULAR REPAIR/STENT GRAFT N/A 11/08/2017   Procedure: ENDOVASCULAR REPAIR/STENT GRAFT;  Surgeon: Annice Needyew, Jason S, MD;  Location: [facility] INVASIVE CV LAB;  Service: Cardiovascular;  Laterality: N/A;  . NO PAST SURGERIES    . TONSILLECTOMY      No Known Allergies Medications Prior to Admission  Medication Sig Dispense Refill Last Dose  . amLODipine (NORVASC) 5 MG tablet Take  1 tablet (5 mg total) by mouth daily. 30 tablet 0 Taking  . Ascorbic Acid (VITAMIN C PO) Take 1 tablet by mouth daily.   Taking  . Bilberry, Vaccinium myrtillus, (BILBERRY PO) Take 1 tablet by mouth daily.   Taking  . Calcium Carbonate-Vitamin D (CALCIUM-VITAMIN D3 PO) Take 1 tablet by mouth daily.   Taking  . Cholecalciferol (VITAMIN D3 PO) Take 1 capsule by mouth daily.   Taking  . Cyanocobalamin (VITAMIN B-12 PO) Take 1 tablet by mouth daily.   Taking  . Homeopathic Products (RA EAR DROPS HOMEOPATHIC OT) Place 2 drops into both ears daily as needed (for stopped up ear).   Taking  . levothyroxine (SYNTHROID, LEVOTHROID) 100 MCG tablet Take 100 mcg by mouth daily.    Taking  . lisinopril (PRINIVIL,ZESTRIL) 5 MG tablet Take 5 mg by mouth daily.   Taking  . Melatonin 10 MG CAPS Take 10 mg by mouth at bedtime as needed (for sleep).   Taking  . ST JOHNS WORT PO Take 1 tablet by mouth daily.   Taking  . VITAMIN E PO Take 1 capsule by mouth daily.   Taking  . acetaminophen (TYLENOL) 325 MG tablet Take 325 mg by mouth every 6 (six) hours as needed for moderate pain or headache.    PRN at PRN  . aspirin EC 81 MG tablet  Take 1 tablet (81 mg total) by mouth daily. (Patient not taking: Reported on 12/07/2017) 150 tablet 2 Not Taking at Unknown time   Family History  Family history unknown: Yes  Note family history is not obtainable and not relevant secondary to advanced age.   Social History   Socioeconomic History  . Marital status: Widowed    Spouse name: Not on file  . Number of children: Not on file  . Years of education: Not on file  . Highest education level: Not on file  Occupational History  . Not on file  Social Needs  . Financial resource strain: Not on file  . Food insecurity:    Worry: Not on file    Inability: Not on file  . Transportation needs:    Medical: Not on file    Non-medical: Not on file  Tobacco Use  . Smoking status: Former Smoker    Types: Cigarettes  . Smokeless  tobacco: Never Used  Substance and Sexual Activity  . Alcohol use: No    Frequency: Never  . Drug use: No  . Sexual activity: Not on file  Lifestyle  . Physical activity:    Days per week: Not on file    Minutes per session: Not on file  . Stress: Not on file  Relationships  . Social connections:    Talks on phone: Not on file    Gets together: Not on file    Attends religious service: Not on file    Active member of club or organization: Not on file    Attends meetings of clubs or organizations: Not on file    Relationship status: Not on file  . Intimate partner violence:    Fear of current or ex partner: Not on file    Emotionally abused: Not on file    Physically abused: Not on file    Forced sexual activity: Not on file  Other Topics Concern  . Not on file  Social History Narrative  . Not on file     ROS:  Unable to obtain reliable information secondary to confusions.   Physical Exam: Blood pressure (!) 201/62, pulse 62, temperature 98 F (36.7 C), temperature source Oral, resp. rate 18, height 5\' 6"  (1.676 m), weight 57.1 kg, SpO2 97 %.  GENERAL:  Well appearing HEENT:  Pupils equal round and reactive, fundi not visualized, oral mucosa unremarkable NECK:  No jugular venous distention, waveform within normal limits, carotid upstroke brisk and symmetric, no bruits, no thyromegaly LYMPHATICS:  No cervical, inguinal adenopathy LUNGS:  Clear to auscultation bilaterally BACK:  No CVA tenderness CHEST:  Unremarkable HEART:  PMI not displaced or sustained,S1 and S2 within normal limits, no S3, no S4, no clicks, no rubs, 3/6 apical systolic murmur, no diastolic murmurs murmurs ABD:  Flat, positive bowel sounds normal in frequency in pitch, no bruits, no rebound, no guarding, no midline pulsatile mass, no hepatomegaly, no splenomegaly EXT:  2 plus pulses throughout, no edema, no cyanosis no clubbing SKIN:  No rashes no nodules NEURO:  Cranial nerves II through XII grossly  intact, motor grossly intact throughout PSYCH:   Confused to time and place  Labs: Lab Results  Component Value Date   BUN 22 05/19/2018   Lab Results  Component Value Date   CREATININE 0.66 05/19/2018   Lab Results  Component Value Date   NA 143 05/19/2018   K 3.9 05/19/2018   CL 110 05/19/2018   CO2 27 05/19/2018  Lab Results  Component Value Date   TROPONINI 0.27 (HH) 05/19/2018   Lab Results  Component Value Date   WBC 8.4 05/19/2018   HGB 11.8 (L) 05/19/2018   HCT 35.1 05/19/2018   MCV 88.3 05/19/2018   PLT 95 (L) 05/19/2018   Lab Results  Component Value Date   CHOL 154 05/19/2018   HDL 49 05/19/2018   LDLCALC 93 05/19/2018   TRIG 62 05/19/2018   CHOLHDL 3.1 05/19/2018   Lab Results  Component Value Date   ALT 15 05/18/2018   AST 27 05/18/2018   ALKPHOS 67 05/18/2018   BILITOT 0.7 05/18/2018      Radiology:   CT HEAD: IMPRESSION: 1. Atrophy with supratentorial small vessel disease. No acute infarct. No mass or hemorrhage. No extra-axial fluid. 2.  Foci of arterial vascular calcification. 3. Diffuse left-sided mastoid air cell disease. Areas of paranasal sinus disease, most notably in the left sphenoid sinus.   EKG:  NSR, rate 65, LAE, IVCD, LAD, no acute ST T wave changes.  No change from previous.    ASSESSMENT AND PLAN:   ELEVATED TROPONIN:  She does have a mildly elevated but flat troponin trend.    She has no symptoms and no change in her EKG.  She is confused but made it clear to me that she would not want any kind of invasive procedure.  Trop elevation could be related to hypertensive urgency and she needs good control of this.  I agree with low dose of beta blocker.   AS:  Moderate to severe.  Unable to gage symptoms but there were none mentioned when she saw Dr. Kirke CorinArida earlier this year.  Follow clinically.  Need to be careful of intake and output.  AAA:   She is status post endovascular repair.     HTN;  BP elevated.  Beta blocker  added.   I would move slowly with her meds given her AS and advanced age.  She can have follow up BP diary.    SignedRollene Rotunda: Rakisha Pincock 05/19/2018, 11:30 AM

## 2018-05-19 NOTE — Progress Notes (Signed)
ANTICOAGULATION CONSULT NOTE Pharmacy Consult for Heparin drip Indication: chest pain/ACS  No Known Allergies  Patient Measurements: Height: 5\' 6"  (167.6 cm) Weight: 125 lb 12.8 oz (57.1 kg) IBW/kg (Calculated) : 59.3 Heparin Dosing Weight: 66 kg  Vital Signs: Temp: 98.3 F (36.8 C) (08/16 2237) Temp Source: Oral (08/16 2237) BP: 225/78 (08/16 2237) Pulse Rate: 72 (08/16 2237)  Labs: Recent Labs    05/18/18 1558 05/18/18 1747 05/18/18 1853 05/19/18 0056  HGB 11.8*  --   --   --   HCT 34.8*  --   --   --   PLT 114*  --   --   --   APTT  --  29  --   --   LABPROT  --  13.2  --   --   INR  --  1.01  --   --   HEPARINUNFRC  --   --   --  0.37  CREATININE 0.82  --   --   --   TROPONINI 0.26*  --  0.28* 0.28*    Estimated Creatinine Clearance: 40.3 mL/min (by C-G formula based on SCr of 0.82 mg/dL).   84 y/o F admitted for facial droop that resolved completely prior to admission to ED via EMS. Patient found to have elevated troponin ordered heparin drip for NSTEMI. No h/o anticoagulation. NIH stroke scale= 0  Goal of Therapy:  Heparin level 0.3-0.7 units/ml Monitor platelets by anticoagulation protocol: Yes   Plan:  08/17 @ 0100 HL 0.37 therapeutic. Will continue current rate and will recheck anti-Xa @ 0800. CBC check with am labs.  Thomasene Rippleavid Brinlynn Gorton, PharmD, BCPS Clinical Pharmacist 05/19/2018

## 2018-05-19 NOTE — Progress Notes (Signed)
   05/19/18 1400  Clinical Encounter Type  Visited With Patient  Visit Type Initial  Referral From Physician  Consult/Referral To Chaplain  Spiritual Encounters  Spiritual Needs Prayer  Stress Factors  Patient Stress Factors Health changes  Family Stress Factors Health changes  Advance Directives (For Healthcare)  Does Patient Have a Medical Advance Directive? No  Would patient like information on creating a medical advance directive? Yes (Inpatient - patient requests chaplain consult to create a medical advance directive)  Mental Health Advance Directives  Does Patient Have a Mental Health Advance Directive? No  Would patient like information on creating a mental health advance directive? No - Patient declined  Received a OR for completion of (AD), presented to patient's room, identified myself as the [facility]) pastoral presence ensued, proceeded with small talk pursuant to her health, hospitalization, family, and support. Identified during conversation that patient has fluctuating periods of lucidity. Likely not candidate to sign an (AD) at this time, (AD) education given, and materials left with patient. [facility]) service to round tomorrow, closed visit with prayer and salutations of blessings, comfort and peace were bestowed upon the patient upon leaving.

## 2018-05-19 NOTE — Care Management Obs Status (Signed)
MEDICARE OBSERVATION STATUS NOTIFICATION   Patient Details  Name: Debra Bartlett MRN: [id number] Date of Birth: 09/19/1925   Medicare Observation Status Notification Given:  Yes    [name], RN 05/19/2018, 10:17 AM

## 2018-05-19 NOTE — Clinical Social Work Note (Signed)
CSW received consult for possible SNF placement. PT is pending. CSW will assess when able.  Debra PonderKaren Martha Lisseth Brazeau, MSW, Theresia MajorsLCSWA [phone]

## 2018-05-19 NOTE — Progress Notes (Signed)
ANTICOAGULATION CONSULT NOTE Pharmacy Consult for Heparin drip Indication: chest pain/ACS  No Known Allergies  Patient Measurements: Height: 5\' 6"  (167.6 cm) Weight: 125 lb 12.8 oz (57.1 kg) IBW/kg (Calculated) : 59.3 Heparin Dosing Weight: 66 kg  Vital Signs: Temp: 98 F (36.7 C) (08/17 0916) Temp Source: Oral (08/17 0916) BP: 201/62 (08/17 0916) Pulse Rate: 62 (08/17 0916)  Labs: Recent Labs    05/18/18 1558 05/18/18 1747 05/18/18 1853 05/19/18 0056 05/19/18 0659 05/19/18 0800  HGB 11.8*  --   --   --  11.8*  --   HCT 34.8*  --   --   --  35.1  --   PLT 114*  --   --   --  95*  --   APTT  --  29  --   --   --   --   LABPROT  --  13.2  --   --   --   --   INR  --  1.01  --   --   --   --   HEPARINUNFRC  --   --   --  0.37  --  0.31  CREATININE 0.82  --   --   --  0.66  --   TROPONINI 0.26*  --  0.28* 0.28* 0.27*  --     Estimated Creatinine Clearance: 41.3 mL/min (by C-G formula based on SCr of 0.66 mg/dL).   84 y/o F admitted for facial droop that resolved completely prior to admission to ED via EMS. Patient found to have elevated troponin ordered heparin drip for NSTEMI. No h/o anticoagulation. NIH stroke scale= 0  Goal of Therapy:  Heparin level 0.3-0.7 units/ml Monitor platelets by anticoagulation protocol: Yes   Plan:  08/17 @ 0800 HL 0.31 therapeutic. Will continue current rate.  Will recheck anti-Xa and CBC with am labs.  Stormy CardKatsoudas,Daziyah Cogan K, [facility] LLCRPH Clinical Pharmacist 05/19/2018

## 2018-05-19 NOTE — Progress Notes (Signed)
[facility] [place] [facility]   PATIENT NAME: Debra Bartlett    MR#:  [id number]  DATE OF BIRTH:  01/27/1925  SUBJECTIVE: Admitted for left facial droop, leaning to the left.  Patient lives alone initially thought she had a stroke but found to have elevated troponins so admitted to telemetry, started on heparin drip.  Patient seen today, has no facial droop, wants to pull out heparin and also taking out all the telemetry wires saying that she wants to go home [place] [employer] [place].  CHIEF COMPLAINT:   Chief Complaint  Patient presents with  . Weakness  She appears slightly confused and looking like she has baseline dementia, following some commands but she does not know she is in the hospital, but oriented to time and date.  NIH stroke scale is 0.  REVIEW OF SYSTEMS:    Review of Systems  Constitutional: Negative for chills and fever.  HENT: Negative for hearing loss.   Eyes: Negative for blurred vision, double vision and photophobia.  Respiratory: Negative for cough, hemoptysis and shortness of breath.   Cardiovascular: Negative for palpitations, orthopnea and leg swelling.  Gastrointestinal: Negative for abdominal pain, diarrhea and vomiting.  Genitourinary: Negative for dysuria and urgency.  Musculoskeletal: Negative for myalgias and neck pain.  Skin: Negative for rash.  Neurological: Negative for dizziness, focal weakness, seizures, weakness and headaches.  Psychiatric/Behavioral: Positive for memory loss. The patient does not have insomnia.     Nutrition: N.p.o. Tolerating Diet: Tolerating PT:      DRUG ALLERGIES:  No Known Allergies  VITALS:  Blood pressure (!) 201/62, pulse 62, temperature 98 F (36.7 C), temperature source Oral, resp. rate 18, height 5\' 6"  (1.676 m), weight 57.1 kg, SpO2 97 %.  PHYSICAL EXAMINATION:   Physical Exam  GENERAL:  84 y.o.-year-old patient lying in the bed with no acute distress.  Trying to pull out  telemetry wires and also removed heparin drip. EYES: Pupils equal, round, reactive to light . No scleral icterus. Extraocular muscles intact.  HEENT: Head atraumatic, normocephalic. Oropharynx and nasopharynx clear.  NECK:  Supple, no jugular venous distention. No thyroid enlargement, no tenderness.  LUNGS: Normal breath sounds bilaterally, no wheezing, rales,rhonchi or crepitation. No use of accessory muscles of respiration.  CARDIOVASCULAR: S1, S2 normal. No murmurs, rubs, or gallops.  ABDOMEN: Soft, nontender, nondistended. Bowel sounds present. No organomegaly or mass.  EXTREMITIES: No pedal edema, cyanosis, or clubbing.  NEUROLOGIC: Cranial nerves II through XII are intact. Muscle strength 5/5 in all extremities. Sensation intact. Gait not checked.  Is able to lift hands, legs against gravity.  PSYCHIATRIC: The patient is alert and oriented x 3.  SKIN: No obvious rash, lesion, or ulcer.    LABORATORY PANEL:   CBC Recent Labs  Lab 05/19/18 0659  WBC 8.4  HGB 11.8*  HCT 35.1  PLT 95*   ------------------------------------------------------------------------------------------------------------------  Chemistries  Recent Labs  Lab 05/18/18 1558 05/19/18 0659  NA 142 143  K 4.3 3.9  CL 110 110  CO2 29 27  GLUCOSE 93 96  BUN 24* 22  CREATININE 0.82 0.66  CALCIUM 9.3 9.2  AST 27  --   ALT 15  --   ALKPHOS 67  --   BILITOT 0.7  --    ------------------------------------------------------------------------------------------------------------------  Cardiac Enzymes Recent Labs  Lab 05/19/18 0659  TROPONINI 0.27*   ------------------------------------------------------------------------------------------------------------------  RADIOLOGY:  Ct Head Wo Contrast  Result Date: 05/18/2018 CLINICAL DATA:  Pain following fall  EXAM: CT HEAD WITHOUT CONTRAST TECHNIQUE: Contiguous axial images were obtained from the base of the skull through the vertex without intravenous  contrast. COMPARISON:  May 18, 2018 study obtained earlier in the day FINDINGS: Brain: There is moderate diffuse atrophy. There is no intracranial mass, hemorrhage, extra-axial fluid collection, or midline shift. Patchy small vessel disease in the centra semiovale bilaterally is stable. Elsewhere gray-white compartments appear normal. No acute infarct evident. Vascular: No hyperdense vessel. There is calcification in each carotid siphon region. Skull: The bony calvarium appears intact. Sinuses/Orbits: There is opacification in the left sphenoid sinus. There is mucosal thickening in several ethmoid air cells. Other visualized paranasal sinuses are clear. Orbits appear symmetric bilaterally. Other: Mastoid air cells are opacified diffusely on the left. Mastoids on the right are clear. IMPRESSION: 1. Atrophy with supratentorial small vessel disease. No acute infarct. No mass or hemorrhage. No extra-axial fluid. 2.  Foci of arterial vascular calcification. 3. Diffuse left-sided mastoid air cell disease. Areas of paranasal sinus disease, most notably in the left sphenoid sinus. Electronically Signed   By: Bretta Bang III M.D.   On: 05/18/2018 19:34   Ct Head Wo Contrast  Result Date: 05/18/2018 CLINICAL DATA:  Facial weakness. [place] the ED via [facility] [place] where they found the patient to be leaning to the left with a left facial droop. Patient denies any pain or symptoms at this time. EXAM: CT HEAD WITHOUT CONTRAST TECHNIQUE: Contiguous axial images were obtained from the base of the skull through the vertex without intravenous contrast. COMPARISON:  02/21/2018 and older studies. FINDINGS: Brain: No evidence of acute infarction, hemorrhage, hydrocephalus, extra-axial collection or mass lesion/mass effect. There is ventricular and sulcal enlargement reflecting moderate generalized atrophy stable from prior study. Patchy areas of white matter hypoattenuation are also noted consistent with  moderate chronic microvascular ischemic change, also stable. Vascular: No hyperdense vessel or unexpected calcification. Skull: Normal. Negative for fracture or focal lesion. Sinuses/Orbits: Visualize globes and orbits are unremarkable. Dependent secretions in the left sphenoid sinus. Remaining visualized sinuses are clear. Other: None. IMPRESSION: 1. No acute intracranial abnormalities. 2. Atrophy and chronic microvascular ischemic change stable from the prior study. Electronically Signed   By: Amie Portland M.D.   On: 05/18/2018 16:09     ASSESSMENT AND PLAN:   Active Problems:   Non-STEMI (non-ST elevated myocardial infarction) [facility])  #84 year old female patient with left facial droop, left-sided leaning initial CT head unremarkable.  Admitted to telemetry because of elevated troponins.  Patient needs repeat CT head, echocardiogram, ultrasound of carotids, continue aspirin and finish full stroke work-up.  Patient has no facial droop, speech is clear, started on soft diet. 2.  Slightly elevated troponins, non-ST elevation MI, patient has no chest pain and has baseline dementia and some confusion and pulling out telemetry wires, heparin and pull out IV access so discontinue heparin drip at this time patient is at high risk for pulling the wires and also IV access so will minimize IV drips. 3.  Advanced dementia, patient still lives alone and has a boyfriend, likely discharge home tomorrow with home health. #4 essential hypertension, uncontrolled, BP still high, DC lisinopril, metoprolol, use IV hydralazine 20 mg every 6 hours for SBP more than 160/90.  Note repeat CT head ordered because of concern for confusion, heparin drip.  To make sure there is no intracranial bleed.  All the records are reviewed and case discussed with Care Management/Social Workerr. Management plans discussed with the  patient, family and they are in agreement.  CODE STATUS: Full code TOTAL TIME TAKING CARE OF THIS  PATIENT: 38 minutes.   POSSIBLE D/C IN 1-2 DAYS, DEPENDING ON CLINICAL CONDITION.   Katha HammingSnehalatha Princes Finger M.D on 05/19/2018 at 10:59 AM  Between 7am to 6pm - Pager - [phone]  After 6pm go to [email or web address] - password [email or web address] [facility]  [facility]  Office  [phone]  CC: Primary care physician; Jaclyn Shaggyate, Denny C, MD

## 2018-05-20 ENCOUNTER — Observation Stay: Payer: Medicare HMO

## 2018-05-20 DIAGNOSIS — R748 Abnormal levels of other serum enzymes: Secondary | ICD-10-CM

## 2018-05-20 LAB — CBC
HCT: 37.6 % (ref 35.0–47.0)
HEMOGLOBIN: 12.8 g/dL (ref 12.0–16.0)
MCH: 29.8 pg (ref 26.0–34.0)
MCHC: 33.9 g/dL (ref 32.0–36.0)
MCV: 87.8 fL (ref 80.0–100.0)
Platelets: 144 10*3/uL — ABNORMAL LOW (ref 150–440)
RBC: 4.29 MIL/uL (ref 3.80–5.20)
RDW: 16.1 % — ABNORMAL HIGH (ref 11.5–14.5)
WBC: 8.1 10*3/uL (ref 3.6–11.0)

## 2018-05-20 LAB — HEPARIN LEVEL (UNFRACTIONATED)

## 2018-05-20 MED ORDER — OLANZAPINE 10 MG IM SOLR
2.5000 mg | Freq: Once | INTRAMUSCULAR | Status: DC
  Filled 2018-05-20: qty 10

## 2018-05-20 MED ORDER — DOCUSATE SODIUM 100 MG PO CAPS
100.0000 mg | ORAL_CAPSULE | Freq: Two times a day (BID) | ORAL | Status: DC
  Administered 2018-05-21 – 2018-05-23 (×3): 100 mg via ORAL
  Filled 2018-05-20 (×3): qty 1

## 2018-05-20 MED ORDER — METOPROLOL TARTRATE 25 MG PO TABS
25.0000 mg | ORAL_TABLET | Freq: Two times a day (BID) | ORAL | Status: DC
  Administered 2018-05-20 – 2018-05-23 (×5): 25 mg via ORAL
  Filled 2018-05-20 (×4): qty 1

## 2018-05-20 MED ORDER — AMLODIPINE BESYLATE 5 MG PO TABS: 2.5000 mg | ORAL_TABLET | Freq: Every day | ORAL | Status: DC

## 2018-05-20 NOTE — Progress Notes (Signed)
   05/20/18 1110  Clinical Encounter Type  Visited With Patient;Health care provider  Visit Type Spiritual support  Referral From Nurse   Visited with patient with sitter present.  Provided pastoral presence and active and reflective listening while patient talked about her life, her church, and her family.  Periods of mild confusion noted, but patient is very pleasant and seemed to appreciate the visit.  Read Scripture to patient.  Encouraged her to request chaplain if she would like a return visit.

## 2018-05-20 NOTE — Progress Notes (Signed)
[facility] [place] at [facility]   PATIENT NAME: Debra Bartlett    MR#:  [id number]  DATE OF BIRTH:  12/24/1924  SUBJECTIVE: Admitted for left facial droop, leaning to the left.  Patient lives alone initially thought she had a stroke but found to have elevated troponins so admitted to telemetry, started on heparin drip.  Discontinued drip yesterday because patient was removing all the wires and also pulling out IV access.  Patient appears comfortable and has a Comptrollersitter at bedside.  Blood pressure is still elevated..  CHIEF COMPLAINT:   Chief Complaint  Patient presents with  . Weakness  Patient receiving PRN Haldol, received Risperdal last night, she managed to get on the elevator as per sitter.  So we have a sitter for safety. REVIEW OF SYSTEMS:  Patient slightly confused but able to tell me about her symptoms Review of Systems  Constitutional: Negative for chills and fever.  HENT: Negative for hearing loss.   Eyes: Negative for blurred vision, double vision and photophobia.  Respiratory: Negative for cough, hemoptysis and shortness of breath.   Cardiovascular: Negative for palpitations, orthopnea and leg swelling.  Gastrointestinal: Negative for abdominal pain, diarrhea and vomiting.  Genitourinary: Negative for dysuria and urgency.  Musculoskeletal: Negative for myalgias and neck pain.  Skin: Negative for rash.  Neurological: Negative for dizziness, focal weakness, seizures, weakness and headaches.  Psychiatric/Behavioral: Positive for memory loss. The patient does not have insomnia.     Nutrition: N.p.o. Tolerating Diet: Tolerating PT:      DRUG ALLERGIES:  No Known Allergies  VITALS:  Blood pressure (!) 228/61, pulse 63, temperature 98.8 F (37.1 C), temperature source Oral, resp. rate 18, height 5\' 6"  (1.676 m), weight 57.1 kg, SpO2 100 %.  PHYSICAL EXAMINATION:   Physical Exam  GENERAL:  84 y.o.-year-old patient lying in the bed with no acute  distress.  Trying to pull out telemetry wires and also removed heparin drip. EYES: Pupils equal, round, reactive to light . No scleral icterus. Extraocular muscles intact.  HEENT: Head atraumatic, normocephalic. Oropharynx and nasopharynx clear.  NECK:  Supple, no jugular venous distention. No thyroid enlargement, no tenderness.  LUNGS: Normal breath sounds bilaterally, no wheezing, rales,rhonchi or crepitation. No use of accessory muscles of respiration.  CARDIOVASCULAR: S1, S2 normal. No murmurs, rubs, or gallops.  ABDOMEN: Soft, nontender, nondistended. Bowel sounds present. No organomegaly or mass.  EXTREMITIES: No pedal edema, cyanosis, or clubbing.  NEUROLOGIC: Cranial nerves II through XII are intact. Muscle strength 5/5 in all extremities. Sensation intact. Gait not checked.  Is able to lift hands, legs against gravity.  PSYCHIATRIC: The patient is alert and oriented x 3.  SKIN: No obvious rash, lesion, or ulcer.    LABORATORY PANEL:   CBC Recent Labs  Lab 05/20/18 0717  WBC 8.1  HGB 12.8  HCT 37.6  PLT 144*   ------------------------------------------------------------------------------------------------------------------  Chemistries  Recent Labs  Lab 05/18/18 1558 05/19/18 0659  NA 142 143  K 4.3 3.9  CL 110 110  CO2 29 27  GLUCOSE 93 96  BUN 24* 22  CREATININE 0.82 0.66  CALCIUM 9.3 9.2  AST 27  --   ALT 15  --   ALKPHOS 67  --   BILITOT 0.7  --    ------------------------------------------------------------------------------------------------------------------  Cardiac Enzymes Recent Labs  Lab 05/19/18 0659  TROPONINI 0.27*   ------------------------------------------------------------------------------------------------------------------  RADIOLOGY:  Ct Head Wo Contrast  Result Date: 05/19/2018 CLINICAL DATA:  Encephalopathy. Dementia. Increased confusion today  EXAM: CT HEAD WITHOUT CONTRAST TECHNIQUE: Contiguous axial images were obtained from  the base of the skull through the vertex without intravenous contrast. COMPARISON:  CT head 05/18/2018 FINDINGS: Brain: Advanced atrophy most severe in the temporal lobe with marked enlargement of the temporal horns, unchanged from prior studies. Extensive chronic white matter changes. No acute infarct, hemorrhage, mass. No midline shift. Vascular: Negative for hyperdense vessel Skull: Negative for fracture Sinuses/Orbits: Air-fluid level left sphenoid sinus. Bilateral cataract surgery Other: None IMPRESSION: Extensive atrophy most prominent in the temporal lobes and hippocampus compatible with dementia. Chronic microvascular ischemia.  No acute abnormality. Electronically Signed   By: Marlan Palau M.D.   On: 05/19/2018 12:36   Ct Head Wo Contrast  Result Date: 05/18/2018 CLINICAL DATA:  Pain following fall EXAM: CT HEAD WITHOUT CONTRAST TECHNIQUE: Contiguous axial images were obtained from the base of the skull through the vertex without intravenous contrast. COMPARISON:  May 18, 2018 study obtained earlier in the day FINDINGS: Brain: There is moderate diffuse atrophy. There is no intracranial mass, hemorrhage, extra-axial fluid collection, or midline shift. Patchy small vessel disease in the centra semiovale bilaterally is stable. Elsewhere gray-white compartments appear normal. No acute infarct evident. Vascular: No hyperdense vessel. There is calcification in each carotid siphon region. Skull: The bony calvarium appears intact. Sinuses/Orbits: There is opacification in the left sphenoid sinus. There is mucosal thickening in several ethmoid air cells. Other visualized paranasal sinuses are clear. Orbits appear symmetric bilaterally. Other: Mastoid air cells are opacified diffusely on the left. Mastoids on the right are clear. IMPRESSION: 1. Atrophy with supratentorial small vessel disease. No acute infarct. No mass or hemorrhage. No extra-axial fluid. 2.  Foci of arterial vascular calcification. 3.  Diffuse left-sided mastoid air cell disease. Areas of paranasal sinus disease, most notably in the left sphenoid sinus. Electronically Signed   By: Bretta Bang III M.D.   On: 05/18/2018 19:34   Ct Head Wo Contrast  Result Date: 05/18/2018 CLINICAL DATA:  Facial weakness. Pt comes into the ED via [facility] [place] where they found the patient to be leaning to the left with a left facial droop. Patient denies any pain or symptoms at this time. EXAM: CT HEAD WITHOUT CONTRAST TECHNIQUE: Contiguous axial images were obtained from the base of the skull through the vertex without intravenous contrast. COMPARISON:  02/21/2018 and older studies. FINDINGS: Brain: No evidence of acute infarction, hemorrhage, hydrocephalus, extra-axial collection or mass lesion/mass effect. There is ventricular and sulcal enlargement reflecting moderate generalized atrophy stable from prior study. Patchy areas of white matter hypoattenuation are also noted consistent with moderate chronic microvascular ischemic change, also stable. Vascular: No hyperdense vessel or unexpected calcification. Skull: Normal. Negative for fracture or focal lesion. Sinuses/Orbits: Visualize globes and orbits are unremarkable. Dependent secretions in the left sphenoid sinus. Remaining visualized sinuses are clear. Other: None. IMPRESSION: 1. No acute intracranial abnormalities. 2. Atrophy and chronic microvascular ischemic change stable from the prior study. Electronically Signed   By: Amie Portland M.D.   On: 05/18/2018 16:09   US Carotid Bilateral  Result Date: 05/19/2018 CLINICAL DATA:  Stroke symptoms, hypertension, hyperlipidemia and tobacco use EXAM: BILATERAL CAROTID DUPLEX ULTRASOUND TECHNIQUE: Wallace Cullens scale imaging, color Doppler and duplex ultrasound were performed of bilateral carotid and vertebral arteries in the neck. COMPARISON:  02/21/2018 FINDINGS: Criteria: Quantification of carotid stenosis is based on velocity parameters that  correlate the residual internal carotid diameter with NASCET-based stenosis levels, using the diameter of the distal  internal carotid lumen as the denominator for stenosis measurement. The following velocity measurements were obtained: RIGHT ICA: 89/12 cm/sec CCA: 66/10 cm/sec SYSTOLIC ICA/CCA RATIO:  1.3 ECA: 84 cm/sec LEFT ICA: 105/17 cm/sec CCA: 76/10 cm/sec SYSTOLIC ICA/CCA RATIO:  1.4 ECA: 106 cm/sec RIGHT CAROTID ARTERY: Minor echogenic shadowing plaque formation. No hemodynamically significant right ICA stenosis, velocity elevation, or turbulent flow. Degree of narrowing less than 50%. RIGHT VERTEBRAL ARTERY:  Antegrade LEFT CAROTID ARTERY: Similar scattered minor echogenic plaque formation. No hemodynamically significant left ICA stenosis, velocity elevation, or turbulent flow. LEFT VERTEBRAL ARTERY:  Antegrade IMPRESSION: Minor carotid atherosclerosis. No hemodynamically significant ICA stenosis. Degree of narrowing less than 50% bilaterally by ultrasound criteria. Patent antegrade vertebral flow bilaterally Electronically Signed   By: Judie PetitM.  Shick M.D.   On: 05/19/2018 14:03     ASSESSMENT AND PLAN:   Active Problems:   Non-STEMI (non-ST elevated myocardial infarction) [facility](HCC)  #31.84 year old female patient with left facial droop, left-sided leaning initial CT head unremarkable.  Admitted to telemetry because of elevated troponins.  P repeat CT head did not show acute changes.  Patient has extensive atrophy more prominent and temporal lobes, hippocampus.  Echocardiogram showed EF 60% with no regional wall motion abnormality.  Severe LVH, severe pulmonary hypertension with severe aortic stenosis.  Ultrasound of carotids showed no hemodynamically significant stenosis.    2.  Slightly elevated troponins, non-ST elevation MI, patient has no chest pain and has baseline dementia and some confusion and pulling out telemetry wires, heparin and pull out IV access so discontinue heparin drip at this time by  cardiology, on beta-blockers, ACE inhibitors.  No invasive cardiac intervention due to her advanced age, dementia.   3.  Advanced dementia,  #4. malignant hypertension: Patient is on beta-blockers, ACE inhibitors, because of her severe aortic stenosis patient needs slow titration of her blood pressure medication as per cardiology note. .  All the records are reviewed and case discussed with Care Management/Social Workerr. Management plans discussed with the patient, family and they are in agreement.  CODE STATUS: Full code TOTAL TIME TAKING CARE OF THIS PATIENT: 38 minutes.   POSSIBLE D/C IN 1-2 DAYS, DEPENDING ON CLINICAL CONDITION.   Katha HammingSnehalatha Stacie Templin M.D on 05/20/2018 at 12:56 PM  Between 7am to 6pm - Pager - [phone]  After 6pm go to [email or web address] - password [email or web address] [facility]  [facility]  Office  [phone]  CC: Primary care physician; Jaclyn Shaggyate, Denny C, MD

## 2018-05-20 NOTE — Clinical Social Work Note (Signed)
Clinical Social Work Assessment  Patient Details  Name: Debra Bartlett MRN: [id number] Date of Birth: [birth year]  Date of referral:  05/20/18               Reason for consult:  Facility Placement                Permission sought to share information with:  Chartered certified accountant granted to share information::  Yes, Verbal Permission Granted  Name::        Agency::  [facility]  Relationship::     Contact Information:     Housing/Transportation Living arrangements for the past 2 months:  [place] of Information:  Patient, Scientist, water quality, Adult Children Patient Interpreter Needed:  None Criminal Activity/Legal Involvement Pertinent to Current Situation/Hospitalization:  No - Comment as needed Significant Relationships:  Adult Children, Warehouse manager, Friend, Other Family Members Lives with:  Self Do you feel safe going back to the place where you live?  Yes Need for family participation in patient care:  Yes (Comment)(Patient shows signs of diminished capacity)  Care giving concerns:  Family seeking placement   Social Worker assessment / plan:  The CSW met with the patient at bedside to discuss discharge planning. The patient was sitting in her recliner. Her friend/significant other Debra Bartlett was in attendance as was the patient's sitter. The patient has a sitter due to attempting to leave yesterday in her gown and no shoes. The patient is alert but only oriented to self and place. She recognizes the year but not the month. She is confused about the situation. The patient is declining any sort of placement, and she seems to believe that she is capable of caring for herself at home. The patient has verbalized that the CSW can speak with her husband but was adamant that the CSW not speak with her DIL Debra Bartlett.  The CSW contacted Debra Bartlett and explained the placement barriers as follows: 1. The patient is ambulating. 2. The patient is under  observation status. 3. The patient is declining SNF/placement of any sort. 4. The patient has a Actuary.  Debra Bartlett shared that he is not surprised by her lack of cooperation regarding her care and he shared his frustration about their relationship. Debra Bartlett understands that the patient has dementia and a long term care plan is needed; however, the patient is not in agreement. The CSW explained that Debra Bartlett may want to consider having the patient assessed for guardianship needs should she continue to have problems with self-care. Debra Bartlett gave verbal permission for a psychiatric consult to address capacity. The CSW shared that the patient does not seem safe to return home due to her behaviors and need for a sitter. Debra Bartlett shared that he has paid people to stay with her in the past, but the patient would "run them off." The CSW provided emotional support.  Currently, as the patient has not been deemed incapable of making medical decisions, the patient wants to return home. CSW will continue to follow to assist in discharge planning.  Employment status:  Retired Nurse, adult PT Recommendations:  Not assessed at this time Information / Referral to community resources:     Patient/Family's Response to care: The patient was not unpleasant to the CSW; however, she is not cooperative in her treatment plan. The patient's son thanked the CSW.  Patient/Family's Understanding of and Emotional Response to Diagnosis, Current Treatment, and Prognosis:  The patient's son seems to understand the barriers  to placement. The patient does not seem capable of making medical decisions at this time.  Emotional Assessment Appearance:  Appears stated age Attitude/Demeanor/Rapport:  Inconsistent, Uncooperative Affect (typically observed):  Stable Orientation:  Oriented to Self, Oriented to Place Alcohol / Substance use:  Never Used Psych involvement (Current and /or in the community):  Yes (Comment)(Psych  consult for capacity to make medical decisions)  Discharge Needs  Concerns to be addressed:  Care Coordination, Discharge Planning Concerns, Cognitive Concerns, Compliance Issues Concerns Readmission within the last 30 days:  No Current discharge risk:  Lives alone, Chronically ill Barriers to Discharge:  Continued Medical Work up, Requiring sitter/restraints, Ship broker, Unsafe home situation   Debra Bartlett, [place] 05/20/2018, 2:06 PM

## 2018-05-20 NOTE — Progress Notes (Signed)
Was the fall witnessed: no  Patient condition before and after the fall: VSS, complaints of hip pain, xray ordered.   Patient's reaction to the fall: none  Name of the doctor that was notified including date and time: Dr. Luberta MutterKonidena at 1345  Any interventions and vital signs: VS taken, xray ordered per MD order.

## 2018-05-20 NOTE — Progress Notes (Signed)
Progress Note  Patient Name: Debra Bartlett Date of Encounter: 05/20/2018  Primary Cardiologist:   Lorine Bears, MD   Subjective   She denies chest pain.  She is confused.  She fell this afternoon and complains of leg pain.  She did not have LOC.   Inpatient Medications    Scheduled Meds: . aspirin EC  81 mg Oral Daily  . atorvastatin  40 mg Oral q1800  . calcium-vitamin D  1 tablet Oral Q breakfast  . levothyroxine  100 mcg Oral QAC breakfast  . lisinopril  5 mg Oral Daily  . metoprolol tartrate  12.5 mg Oral BID  . OLANZapine  2.5 mg Intramuscular Once  . sodium chloride flush  3 mL Intravenous Q12H  . vitamin B-12  50 mcg Oral Daily  . vitamin C  500 mg Oral Daily  . vitamin E  100 Units Oral Daily   Continuous Infusions: . sodium chloride     PRN Meds: sodium chloride, acetaminophen, haloperidol, haloperidol lactate, hydrALAZINE, Melatonin, nitroGLYCERIN, ondansetron (ZOFRAN) IV, sodium chloride flush   Vital Signs    Vitals:   05/19/18 1105 05/19/18 1514 05/19/18 1926 05/20/18 0746  BP: (!) 182/63 (!) 160/56 (!) 189/87 (!) 228/61  Pulse: 66 71 69 63  Resp: 16  18 18   Temp: 98 F (36.7 C) 97.7 F (36.5 C) 98.8 F (37.1 C)   TempSrc:  Oral Oral   SpO2: 98% 98% 100% 100%  Weight:      Height:        Intake/Output Summary (Last 24 hours) at 05/20/2018 1335 Last data filed at 05/19/2018 2255 Gross per 24 hour  Intake -  Output 650 ml  Net -650 ml   Filed Weights   05/18/18 1554 05/18/18 2237  Weight: 65.8 kg 57.1 kg    Telemetry    NSR - Personally Reviewed  ECG    NA - Personally Reviewed  Physical Exam   GEN: No acute distress.   Neck: No  JVD Cardiac: rRR, 3/6 apical systolic murmur, no diastolic murmurs, rubs, or gallops.  Respiratory: Clear  to auscultation bilaterally. GI: Soft, nontender, non-distended  MS: No  edema; No deformity. Neuro:  Nonfocal  Psych:   Confused and wants to go home.   Labs    Chemistry Recent Labs  Lab  05/18/18 1558 05/19/18 0659  NA 142 143  K 4.3 3.9  CL 110 110  CO2 29 27  GLUCOSE 93 96  BUN 24* 22  CREATININE 0.82 0.66  CALCIUM 9.3 9.2  PROT 6.4*  --   ALBUMIN 3.6  --   AST 27  --   ALT 15  --   ALKPHOS 67  --   BILITOT 0.7  --   GFRNONAA >60 >60  GFRAA >60 >60  ANIONGAP 3* 6     Hematology Recent Labs  Lab 05/18/18 1558 05/19/18 0659 05/20/18 0717  WBC 9.7 8.4 8.1  RBC 3.99 3.98 4.29  HGB 11.8* 11.8* 12.8  HCT 34.8* 35.1 37.6  MCV 87.1 88.3 87.8  MCH 29.7 29.6 29.8  MCHC 34.1 33.6 33.9  RDW 15.9* 16.0* 16.1*  PLT 114* 95* 144*    Cardiac Enzymes Recent Labs  Lab 05/18/18 1558 05/18/18 1853 05/19/18 0056 05/19/18 0659  TROPONINI 0.26* 0.28* 0.28* 0.27*   No results for input(s): TROPIPOC in the last 168 hours.   BNPNo results for input(s): BNP, PROBNP in the last 168 hours.   DDimer No results for input(s):  DDIMER in the last 168 hours.   Radiology    Ct Head Wo Contrast  Result Date: 05/19/2018 CLINICAL DATA:  Encephalopathy. Dementia. Increased confusion today EXAM: CT HEAD WITHOUT CONTRAST TECHNIQUE: Contiguous axial images were obtained from the base of the skull through the vertex without intravenous contrast. COMPARISON:  CT head 05/18/2018 FINDINGS: Brain: Advanced atrophy most severe in the temporal lobe with marked enlargement of the temporal horns, unchanged from prior studies. Extensive chronic white matter changes. No acute infarct, hemorrhage, mass. No midline shift. Vascular: Negative for hyperdense vessel Skull: Negative for fracture Sinuses/Orbits: Air-fluid level left sphenoid sinus. Bilateral cataract surgery Other: None IMPRESSION: Extensive atrophy most prominent in the temporal lobes and hippocampus compatible with dementia. Chronic microvascular ischemia.  No acute abnormality. Electronically Signed   By: Marlan Palauharles  Clark M.D.   On: 05/19/2018 12:36   Ct Head Wo Contrast  Result Date: 05/18/2018 CLINICAL DATA:  Pain following fall  EXAM: CT HEAD WITHOUT CONTRAST TECHNIQUE: Contiguous axial images were obtained from the base of the skull through the vertex without intravenous contrast. COMPARISON:  May 18, 2018 study obtained earlier in the day FINDINGS: Brain: There is moderate diffuse atrophy. There is no intracranial mass, hemorrhage, extra-axial fluid collection, or midline shift. Patchy small vessel disease in the centra semiovale bilaterally is stable. Elsewhere gray-white compartments appear normal. No acute infarct evident. Vascular: No hyperdense vessel. There is calcification in each carotid siphon region. Skull: The bony calvarium appears intact. Sinuses/Orbits: There is opacification in the left sphenoid sinus. There is mucosal thickening in several ethmoid air cells. Other visualized paranasal sinuses are clear. Orbits appear symmetric bilaterally. Other: Mastoid air cells are opacified diffusely on the left. Mastoids on the right are clear. IMPRESSION: 1. Atrophy with supratentorial small vessel disease. No acute infarct. No mass or hemorrhage. No extra-axial fluid. 2.  Foci of arterial vascular calcification. 3. Diffuse left-sided mastoid air cell disease. Areas of paranasal sinus disease, most notably in the left sphenoid sinus. Electronically Signed   By: Bretta BangWilliam  Woodruff III M.D.   On: 05/18/2018 19:34   Ct Head Wo Contrast  Result Date: 05/18/2018 CLINICAL DATA:  Facial weakness. Pt comes into the ED via [facility] [place] where they found the patient to be leaning to the left with a left facial droop. Patient denies any pain or symptoms at this time. EXAM: CT HEAD WITHOUT CONTRAST TECHNIQUE: Contiguous axial images were obtained from the base of the skull through the vertex without intravenous contrast. COMPARISON:  02/21/2018 and older studies. FINDINGS: Brain: No evidence of acute infarction, hemorrhage, hydrocephalus, extra-axial collection or mass lesion/mass effect. There is ventricular and sulcal  enlargement reflecting moderate generalized atrophy stable from prior study. Patchy areas of white matter hypoattenuation are also noted consistent with moderate chronic microvascular ischemic change, also stable. Vascular: No hyperdense vessel or unexpected calcification. Skull: Normal. Negative for fracture or focal lesion. Sinuses/Orbits: Visualize globes and orbits are unremarkable. Dependent secretions in the left sphenoid sinus. Remaining visualized sinuses are clear. Other: None. IMPRESSION: 1. No acute intracranial abnormalities. 2. Atrophy and chronic microvascular ischemic change stable from the prior study. Electronically Signed   By: Amie Portlandavid  Ormond M.D.   On: 05/18/2018 16:09   [place] Carotid Bilateral  Result Date: 05/19/2018 CLINICAL DATA:  Stroke symptoms, hypertension, hyperlipidemia and tobacco use EXAM: BILATERAL CAROTID DUPLEX ULTRASOUND TECHNIQUE: Wallace CullensGray scale imaging, color Doppler and duplex ultrasound were performed of bilateral carotid and vertebral arteries in the neck. COMPARISON:  02/21/2018 FINDINGS:  Criteria: Quantification of carotid stenosis is based on velocity parameters that correlate the residual internal carotid diameter with NASCET-based stenosis levels, using the diameter of the distal internal carotid lumen as the denominator for stenosis measurement. The following velocity measurements were obtained: RIGHT ICA: 89/12 cm/sec CCA: 66/10 cm/sec SYSTOLIC ICA/CCA RATIO:  1.3 ECA: 84 cm/sec LEFT ICA: 105/17 cm/sec CCA: 76/10 cm/sec SYSTOLIC ICA/CCA RATIO:  1.4 ECA: 106 cm/sec RIGHT CAROTID ARTERY: Minor echogenic shadowing plaque formation. No hemodynamically significant right ICA stenosis, velocity elevation, or turbulent flow. Degree of narrowing less than 50%. RIGHT VERTEBRAL ARTERY:  Antegrade LEFT CAROTID ARTERY: Similar scattered minor echogenic plaque formation. No hemodynamically significant left ICA stenosis, velocity elevation, or turbulent flow. LEFT VERTEBRAL ARTERY:   Antegrade IMPRESSION: Minor carotid atherosclerosis. No hemodynamically significant ICA stenosis. Degree of narrowing less than 50% bilaterally by ultrasound criteria. Patent antegrade vertebral flow bilaterally Electronically Signed   By: Judie PetitM.  Shick M.D.   On: 05/19/2018 14:03    Cardiac Studies   ECHO:  05/20/18  Study Conclusions  - Left ventricle: The cavity size was normal. There was severe   concentric hypertrophy. Systolic function was normal. The   estimated ejection fraction was 60%. Wall motion was normal;   there were no regional wall motion abnormalities. Doppler   parameters are consistent with abnormal left ventricular   relaxation (grade 1 diastolic dysfunction). - Aortic valve: There was mild regurgitation. Valve area (VTI): 0.8   cm^2. Valve area (Vmax): 0.84 cm^2. Valve area (Vmean): 0.86   cm^2. - Mitral valve: Calcified annulus. There was mild regurgitation.   Valve area by continuity equation (using LVOT flow): 1.72 cm^2. - Left atrium: The atrium was mildly dilated. - Atrial septum: No defect or patent foramen ovale was identified. - Tricuspid valve: There was severe regurgitation. - Pulmonary arteries: PA peak pressure: 51 mm Hg (S).  Patient Profile     84 y.o. female admitted with facial droop but no acute imaging findings and symptoms resolved.  We were consulted because of elevated cardiac enzymes.  No clinical context to support ACS.  Known severe AS  Assessment & Plan    ELEVATED TROPONIN:   Probably secondary to HTN.  No new wall motion abnormality on echo.  She would not be a candidate for further evaluation such as cath with her confusion.  She told me yesterday that she did not want further evaluation.  Of note she has no active chest pain or breathing symptoms.  I will try to address the BP as below.    AS:  Medical management.   HTN: BP is very elevated.  I will add low dose Norvasc and increase the beta blocker.   I do note that her metoprolol was  not given last night because she was asleep.      For questions or updates, please contact [facility] Please consult [email or web address] for contact info under Cardiology/STEMI.   Signed, Rollene RotundaJames Haldon Carley, MD  05/20/2018, 1:35 PM

## 2018-05-20 NOTE — Progress Notes (Signed)
d had a fall and landed on the hip.  Get x-rays of both hips.  Continue sitter all the time for safety.  disCussed with family.
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
## 2018-05-21 DIAGNOSIS — I161 Hypertensive emergency: Secondary | ICD-10-CM | POA: Diagnosis present

## 2018-05-21 DIAGNOSIS — W19XXXA Unspecified fall, initial encounter: Secondary | ICD-10-CM | POA: Diagnosis not present

## 2018-05-21 DIAGNOSIS — E039 Hypothyroidism, unspecified: Secondary | ICD-10-CM | POA: Diagnosis present

## 2018-05-21 DIAGNOSIS — I10 Essential (primary) hypertension: Secondary | ICD-10-CM

## 2018-05-21 DIAGNOSIS — Z79890 Hormone replacement therapy (postmenopausal): Secondary | ICD-10-CM | POA: Diagnosis not present

## 2018-05-21 DIAGNOSIS — Z79899 Other long term (current) drug therapy: Secondary | ICD-10-CM | POA: Diagnosis not present

## 2018-05-21 DIAGNOSIS — F0281 Dementia in other diseases classified elsewhere with behavioral disturbance: Secondary | ICD-10-CM | POA: Diagnosis present

## 2018-05-21 DIAGNOSIS — Z95828 Presence of other vascular implants and grafts: Secondary | ICD-10-CM | POA: Diagnosis not present

## 2018-05-21 DIAGNOSIS — I272 Pulmonary hypertension, unspecified: Secondary | ICD-10-CM | POA: Diagnosis present

## 2018-05-21 DIAGNOSIS — G8194 Hemiplegia, unspecified affecting left nondominant side: Secondary | ICD-10-CM | POA: Diagnosis present

## 2018-05-21 DIAGNOSIS — I248 Other forms of acute ischemic heart disease: Secondary | ICD-10-CM | POA: Diagnosis not present

## 2018-05-21 DIAGNOSIS — Z87891 Personal history of nicotine dependence: Secondary | ICD-10-CM | POA: Diagnosis not present

## 2018-05-21 DIAGNOSIS — R29700 NIHSS score 0: Secondary | ICD-10-CM | POA: Diagnosis present

## 2018-05-21 DIAGNOSIS — I714 Abdominal aortic aneurysm, without rupture: Secondary | ICD-10-CM | POA: Diagnosis present

## 2018-05-21 DIAGNOSIS — G309 Alzheimer's disease, unspecified: Secondary | ICD-10-CM | POA: Diagnosis present

## 2018-05-21 DIAGNOSIS — R29810 Facial weakness: Secondary | ICD-10-CM | POA: Diagnosis present

## 2018-05-21 DIAGNOSIS — Z7982 Long term (current) use of aspirin: Secondary | ICD-10-CM | POA: Diagnosis not present

## 2018-05-21 DIAGNOSIS — I214 Non-ST elevation (NSTEMI) myocardial infarction: Secondary | ICD-10-CM | POA: Diagnosis present

## 2018-05-21 DIAGNOSIS — H9190 Unspecified hearing loss, unspecified ear: Secondary | ICD-10-CM | POA: Diagnosis present

## 2018-05-21 DIAGNOSIS — I350 Nonrheumatic aortic (valve) stenosis: Secondary | ICD-10-CM | POA: Diagnosis present

## 2018-05-21 MED ORDER — AMLODIPINE BESYLATE 5 MG PO TABS
5.0000 mg | ORAL_TABLET | Freq: Every day | ORAL | Status: DC
  Administered 2018-05-21 – 2018-05-23 (×3): 5 mg via ORAL
  Filled 2018-05-21 (×3): qty 1

## 2018-05-21 MED ORDER — LISINOPRIL 10 MG PO TABS
10.0000 mg | ORAL_TABLET | Freq: Every day | ORAL | Status: DC
  Administered 2018-05-21 – 2018-05-23 (×3): 10 mg via ORAL
  Filled 2018-05-21 (×3): qty 1

## 2018-05-21 MED ORDER — POLYETHYLENE GLYCOL 3350 17 G PO PACK
17.0000 g | PACK | Freq: Every day | ORAL | Status: DC
  Administered 2018-05-21 – 2018-05-23 (×2): 17 g via ORAL
  Filled 2018-05-21: qty 1

## 2018-05-21 MED ORDER — ENOXAPARIN SODIUM 40 MG/0.4ML ~~LOC~~ SOLN
40.0000 mg | SUBCUTANEOUS | Status: DC
  Administered 2018-05-22: 40 mg via SUBCUTANEOUS
  Filled 2018-05-21: qty 0.4

## 2018-05-21 NOTE — Evaluation (Signed)
Physical Therapy Evaluation Patient Details Name: Ballard Russellmma R Hollen MRN: [id number] DOB: 08/15/1925 Today's Date: 05/21/2018   History of Present Illness  Pt is a 84 y.o. female with a known history of hypertension, hypothyroidism, abdominal aortic aneurysm was brought to the emergency room by EMS.  Patient was noticed to be leaning to the left according to the boyfriend.  Patient was worked up for CVA in the emergency room but CT head was negative for any acute abnormality.  During the work-up in the emergency room her troponin was elevated.  Patient does not complain of any chest pain.  She was started on heparin drip for anticoagulation in the emergency room.  No tingling or numbness in any part of the body.  No weakness in any part of the body.  Patient is hard of hearing.  Assessment includes: accelerated HTN, left-sided leaning with multiple head CT's negative, severe LVH, pulmonary HTN, and aortic stenosis, dementia, and slightly elevated troponins that per cardiology likely due to supply demand ischemia in the setting of malignant hypertension.    Clinical Impression  Pt presents with deficits in strength, transfers, mobility, gait, balance, and activity tolerance.  Pt required Min A for transfers along with extensive cueing for sequencing.  Pt was able to amb around 15' with a RW and with Mod A for stability.  Pt leaned significantly to the left in sitting and in standing and is at a very high risk for falls.  During seated balance and core training pt was able to lean to her right with heavy cues but only just past neutral.  Only mild deficits in strength to the LUE and LLE were noted compared to the right along with a slight delay in L-sided muscle activation on command.  No deficits in sensation, visual tracking, or peripheral vision noted.  Overall pt presents with a significant decline in functional mobility compared to her baseline level and would be unsafe to return home at this time.  Pt will  benefit from PT services in a SNF setting upon discharge to safely address above deficits for decreased caregiver assistance and eventual return to PLOF.      Follow Up Recommendations SNF;Supervision for mobility/OOB    Equipment Recommendations  Other (comment);Rolling walker with 5" wheels(TBD at next venue of care)    Recommendations for Other Services       Precautions / Restrictions Precautions Precautions: Fall Restrictions Weight Bearing Restrictions: No      Mobility  Bed Mobility               General bed mobility comments: NT with pt in recliner  Transfers Overall transfer level: Needs assistance Equipment used: Rolling walker (2 wheeled) Transfers: Sit to/from Stand Sit to Stand: Min assist         General transfer comment: Poor eccentric control with mod to max verbal and tactile cues for sequencing  Ambulation/Gait Ambulation/Gait assistance: Mod assist Gait Distance (Feet): 15 Feet Assistive device: Rolling walker (2 wheeled) Gait Pattern/deviations: Step-to pattern;Decreased step length - right;Decreased step length - left Gait velocity: Decreased   General Gait Details: Pt reported LLE hip pain during the session but leanded to her left during amb with mod A for stability on multiple occasions and max verbal cues for general sequencing  Stairs            Wheelchair Mobility    Modified Rankin (Stroke Patients Only)       Balance Overall balance assessment: Needs assistance  Sitting balance-Leahy Scale: Fair   Postural control: Left lateral lean Standing balance support: Bilateral upper extremity supported Standing balance-Leahy Scale: Poor Standing balance comment: L lateral lean in standing and during amb                             Pertinent Vitals/Pain Pain Assessment: 0-10 Pain Score: 9  Pain Location: L hip Pain Descriptors / Indicators: Sore Pain Intervention(s): Patient requesting pain meds-RN  notified;Monitored during session;Limited activity within patient's tolerance    Home Living Family/patient expects to be discharged to:: Private residence(History from DIL via phone call) Living Arrangements: Alone Available Help at Discharge: Family;Neighbor;Friend(s);Available PRN/intermittently Type of Home: House Home Access: Stairs to enter Entrance Stairs-Rails: Right Entrance Stairs-Number of Steps: 5 Home Layout: Two level;Able to live on main level with bedroom/bathroom Home Equipment: Gilmer MorCane - single point;Cane - quad Additional Comments: Family unsure if pt still has her RW, may have given it away    Prior Function Level of Independence: Needs assistance   Gait / Transfers Assistance Needed: Pt Ind with amb without an AD in the home, Ind with bed mobility and transfers, 3-4 falls in the last 4 months  ADL's / Homemaking Assistance Needed: Pt Ind with ADLs with family driving pt and occasionally helping to prepare meals        Hand Dominance        Extremity/Trunk Assessment   Upper Extremity Assessment Upper Extremity Assessment: Generalized weakness    Lower Extremity Assessment Lower Extremity Assessment: Generalized weakness;RLE deficits/detail;LLE deficits/detail RLE Deficits / Details: RLE grossly 4/5 RLE Sensation: WNL LLE Deficits / Details: LLE grossly 4-/5 with onset of contraction slightly delayed compared to the RLE LLE Sensation: WNL(BLE sensation assessment may be unreliable secondary to pt confusion)       Communication      Cognition Arousal/Alertness: Awake/alert Behavior During Therapy: Flat affect Overall Cognitive Status: No family/caregiver present to determine baseline cognitive functioning                                 General Comments: Pt required extra time and cueing to follow simple commands.  Overall pt was confused and perseverated on thoughts throughout the session.      General Comments      Exercises  Total Joint Exercises Ankle Circles/Pumps: Strengthening;Both;10 reps(verbal and tactile cues required during therex for sequencing) Hip ABduction/ADduction: Strengthening;Both;10 reps Long Arc Quad: Strengthening;Both;5 reps;10 reps Knee Flexion: Strengthening;Both;5 reps;10 reps Other Exercises Other Exercises: Seated balance training and core therex with focus on right lateral weight shifting   Assessment/Plan    PT Assessment Patient needs continued PT services  PT Problem List Decreased strength;Decreased activity tolerance;Decreased balance;Decreased knowledge of use of DME;Decreased mobility;Decreased safety awareness       PT Treatment Interventions DME instruction;Gait training;Stair training;Functional mobility training;Balance training;Therapeutic exercise;Therapeutic activities;Patient/family education    PT Goals (Current goals can be found in the Care Plan section)  Acute Rehab PT Goals PT Goal Formulation: Patient unable to participate in goal setting Time For Goal Achievement: 06/03/18 Potential to Achieve Goals: Fair    Frequency Min 2X/week   Barriers to discharge Inaccessible home environment;Decreased caregiver support      Co-evaluation               AM-PAC PT "6 Clicks" Daily Activity  Outcome Measure Difficulty turning over in bed (including adjusting  bedclothes, sheets and blankets)?: A Little Difficulty moving from lying on back to sitting on the side of the bed? : A Little Difficulty sitting down on and standing up from a chair with arms (e.g., wheelchair, bedside commode, etc,.)?: A Little Help needed moving to and from a bed to chair (including a wheelchair)?: A Little Help needed walking in hospital room?: A Lot Help needed climbing 3-5 steps with a railing? : A Lot 6 Click Score: 16    End of Session Equipment Utilized During Treatment: Gait belt Activity Tolerance: Patient tolerated treatment well Patient left: in chair;with  nursing/sitter in room;with call bell/phone within reach;with chair alarm set Nurse Communication: Mobility status PT Visit Diagnosis: Unsteadiness on feet (R26.81);Difficulty in walking, not elsewhere classified (R26.2);History of falling (Z91.81);Muscle weakness (generalized) (M62.81)    Time: 4782-9562 PT Time Calculation (min) (ACUTE ONLY): 30 min   Charges:   PT Evaluation $PT Eval Low Complexity: 1 Low PT Treatments $Therapeutic Exercise: 8-22 mins        D. Elly Modena PT, DPT 05/21/18, 3:25 PM

## 2018-05-21 NOTE — Progress Notes (Signed)
Progress Note  Patient Name: Debra Bartlett Date of Encounter: 05/21/2018  Primary Cardiologist: Kirke CorinArida  Subjective   She remains confused this morning. She suffered a fall on 05/20/18. No LOC. X-ray negative bilaterally. She denies any chest pain or SOB. BP remains elevated into the 180s systolic.   Inpatient Medications    Scheduled Meds: . amLODipine  2.5 mg Oral Daily  . aspirin EC  81 mg Oral Daily  . atorvastatin  40 mg Oral q1800  . calcium-vitamin D  1 tablet Oral Q breakfast  . docusate sodium  100 mg Oral BID  . levothyroxine  100 mcg Oral QAC breakfast  . lisinopril  5 mg Oral Daily  . metoprolol tartrate  25 mg Oral BID  . OLANZapine  2.5 mg Intramuscular Once  . sodium chloride flush  3 mL Intravenous Q12H  . vitamin B-12  50 mcg Oral Daily  . vitamin C  500 mg Oral Daily  . vitamin E  100 Units Oral Daily   Continuous Infusions: . sodium chloride     PRN Meds: sodium chloride, acetaminophen, haloperidol, haloperidol lactate, hydrALAZINE, Melatonin, nitroGLYCERIN, ondansetron (ZOFRAN) IV, sodium chloride flush   Vital Signs    Vitals:   05/20/18 1342 05/20/18 2117 05/21/18 0535 05/21/18 0715  BP: (!) 165/55 (!) 156/83 (!) 162/41 (!) 184/112  Pulse: 70 84 (!) 55 61  Resp:   16   Temp:  100.1 F (37.8 C) 98.4 F (36.9 C) 98 F (36.7 C)  TempSrc:  Oral Oral Oral  SpO2: 100% 99% 97% 98%  Weight:      Height:       No intake or output data in the 24 hours ending 05/21/18 0822 Filed Weights   05/18/18 1554 05/18/18 2237  Weight: 65.8 kg 57.1 kg    Telemetry    NSR, 70s to 80s bpm - Personally Reviewed  ECG   n/a - Personally Reviewed  Physical Exam   GEN: No acute distress. Confused.  Neck: No JVD. Cardiac: RRR, III/VI systolic murmur in the aortic area, no rubs, or gallops.  Respiratory: Clear to auscultation bilaterally.  GI: Soft, nontender, non-distended.   MS: No edema; No deformity. Neuro:  Alert; Nonfocal.  Psych: Normal  affect.  Labs    Chemistry Recent Labs  Lab 05/18/18 1558 05/19/18 0659  NA 142 143  K 4.3 3.9  CL 110 110  CO2 29 27  GLUCOSE 93 96  BUN 24* 22  CREATININE 0.82 0.66  CALCIUM 9.3 9.2  PROT 6.4*  --   ALBUMIN 3.6  --   AST 27  --   ALT 15  --   ALKPHOS 67  --   BILITOT 0.7  --   GFRNONAA >60 >60  GFRAA >60 >60  ANIONGAP 3* 6     Hematology Recent Labs  Lab 05/18/18 1558 05/19/18 0659 05/20/18 0717  WBC 9.7 8.4 8.1  RBC 3.99 3.98 4.29  HGB 11.8* 11.8* 12.8  HCT 34.8* 35.1 37.6  MCV 87.1 88.3 87.8  MCH 29.7 29.6 29.8  MCHC 34.1 33.6 33.9  RDW 15.9* 16.0* 16.1*  PLT 114* 95* 144*    Cardiac Enzymes Recent Labs  Lab 05/18/18 1558 05/18/18 1853 05/19/18 0056 05/19/18 0659  TROPONINI 0.26* 0.28* 0.28* 0.27*   No results for input(s): TROPIPOC in the last 168 hours.   BNPNo results for input(s): BNP, PROBNP in the last 168 hours.   DDimer No results for input(s): DDIMER in the  last 168 hours.   Radiology    Ct Head Wo Contrast  Result Date: 05/19/2018 IMPRESSION: Extensive atrophy most prominent in the temporal lobes and hippocampus compatible with dementia. Chronic microvascular ischemia.  No acute abnormality. Electronically Signed   By: Marlan Palauharles  Clark M.D.   On: 05/19/2018 12:36   [place] Carotid Bilateral  Result Date: 05/19/2018 IMPRESSION: Minor carotid atherosclerosis. No hemodynamically significant ICA stenosis. Degree of narrowing less than 50% bilaterally by ultrasound criteria. Patent antegrade vertebral flow bilaterally Electronically Signed   By: Judie PetitM.  Shick M.D.   On: 05/19/2018 14:03   Dg Hip Unilat With Pelvis 2-3 Views Left  Result Date: 05/20/2018 IMPRESSION: 1. No fracture deformity or dislocation. No advanced degenerative change for age. Electronically Signed   By: Awilda Metroourtnay  Bloomer M.D.   On: 05/20/2018 16:49   Dg Hip Unilat With Pelvis 2-3 Views Right  Result Date: 05/20/2018 IMPRESSION: No acute abnormality. Electronically Signed    By: Harmon PierJeffrey  Hu M.D.   On: 05/20/2018 16:51    Cardiac Studies   Echo 05/19/2018: Study Conclusions  - Left ventricle: The cavity size was normal. There was severe   concentric hypertrophy. Systolic function was normal. The   estimated ejection fraction was 60%. Wall motion was normal;   there were no regional wall motion abnormalities. Doppler   parameters are consistent with abnormal left ventricular   relaxation (grade 1 diastolic dysfunction). - Aortic valve: There was mild regurgitation. Valve area (VTI): 0.8   cm^2. Valve area (Vmax): 0.84 cm^2. Valve area (Vmean): 0.86   cm^2. - Mitral valve: Calcified annulus. There was mild regurgitation.   Valve area by continuity equation (using LVOT flow): 1.72 cm^2. - Left atrium: The atrium was mildly dilated. - Atrial septum: No defect or patent foramen ovale was identified. - Tricuspid valve: There was severe regurgitation. - Pulmonary arteries: PA peak pressure: 51 mm Hg (S).  Impressions:  - The right ventricular systolic pressure was increased consistent   with severe pulmonary hypertension. Normal LVEF grade 1 diastolic   dysfunction, severe AS, mild AR, fibrocalcified with limited   mobility of Mitral valve with no significant MS. __________  Patient Profile     84 y.o. female admitted with facial droop but no acute imaging findings and symptoms resolved.  We were consulted because of elevated cardiac enzymes in the setting of no clinical context to support ACS with known severe AS.  Assessment & Plan    1. Elevated troponin: -Minimally elevated with a peak of 0.28 -Likely supply demand ischemia in the setting of malignant hypertension -No chest pain -Echo with preserved LVSF and no new WMA -  2. Aortic stenosis: -Known -Not a candidate for invasive procedure given advanced age -Medical management  3. Pulmonary hypertension: -Denies any SOB -Consider addition of Lasix with possible Revatio down the road   -Needs pulmonology follow up  4. HTN: -Blood pressure remains poorly controlled in the 180s systolic -Increase Norvasc to 5 mg daily with likely continued escalation on 8/20 -Increase lisinopril to 10 mg daily -Continue Lopressor 25 mg bid  For questions or updates, please contact [facility] Please consult [email or web address] for contact info under Cardiology/STEMI.    Signed, Eula Listenyan Ether Wolters, PA-C [facility] Pager: [phone] 05/21/2018, 8:22 AM

## 2018-05-21 NOTE — [place] FL2 (Signed)
[place] MEDICAID FL2 LEVEL OF CARE SCREENING TOOL     IDENTIFICATION  Patient Name: Debra Bartlett Birthdate: 10/08/1924 Sex: female Admission Date (Current Location): 05/18/2018  [place]   Facility and Address:  [facility], [address], [place], [place] [address]      Provider Number: [id number]  Attending Physician Name and Address:  Katha HammingKonidena, Snehalatha, MD  Relative Name and Phone Number:       Current Level of Care: Hospital Recommended Level of Care: Skilled Nursing Facility Prior Approval Number:    Date Approved/Denied:   PASRR Number: ([id number])  Discharge Plan: SNF    Current Diagnoses: Patient Active Problem List   Diagnosis Date Noted  . Accelerated hypertension 05/18/2018  . Acute metabolic encephalopathy 11/09/2017  . Hypertension 09/19/2017  . AAA (abdominal aortic aneurysm) without rupture (HCC) 09/19/2017    Orientation RESPIRATION BLADDER Height & Weight     Self  Normal Continent Weight: 125 lb 12.8 oz (57.1 kg) Height:  5\' 6"  (167.6 cm)  BEHAVIORAL SYMPTOMS/MOOD NEUROLOGICAL BOWEL NUTRITION STATUS      Continent Diet(Diet: Soft )  AMBULATORY STATUS COMMUNICATION OF NEEDS Skin   Extensive Assist Verbally Normal                       Personal Care Assistance Level of Assistance  Bathing, Feeding, Dressing Bathing Assistance: Limited assistance Feeding assistance: Independent Dressing Assistance: Limited assistance     Functional Limitations Info  Sight, Hearing, Speech Sight Info: Adequate Hearing Info: Adequate Speech Info: Adequate    SPECIAL CARE FACTORS FREQUENCY  PT (By licensed PT), OT (By licensed OT)     PT Frequency: (5) OT Frequency: (5)            Contractures      Additional Factors Info  Code Status, Allergies Code Status Info: (Full Code. ) Allergies Info: (No Known Allergies. )           Current Medications (05/21/2018):  This is the  current hospital active medication list Current Facility-Administered Medications  Medication Dose Route Frequency Provider Last Rate Last Dose  . 0.9 %  sodium chloride infusion  250 mL Intravenous PRN Pyreddy, Vivien RotaPavan, MD      . acetaminophen (TYLENOL) tablet 325 mg  325 mg Oral Q6H PRN Ihor AustinPyreddy, Pavan, MD   325 mg at 05/21/18 1422  . amLODipine (NORVASC) tablet 5 mg  5 mg Oral Daily Eula ListenDunn, Ryan M, PA-C   5 mg at 05/21/18 1009  . aspirin EC tablet 81 mg  81 mg Oral Daily Pyreddy, Vivien RotaPavan, MD   81 mg at 05/21/18 1009  . atorvastatin (LIPITOR) tablet 40 mg  40 mg Oral q1800 Ihor AustinPyreddy, Pavan, MD   40 mg at 05/20/18 1801  . calcium-vitamin D (OSCAL WITH D) 500-200 MG-UNIT per tablet 1 tablet  1 tablet Oral Q breakfast Ihor AustinPyreddy, Pavan, MD   1 tablet at 05/20/18 0744  . docusate sodium (COLACE) capsule 100 mg  100 mg Oral BID Cammy CopaMaier, Angela, MD   100 mg at 05/21/18 1009  . enoxaparin (LOVENOX) injection 40 mg  40 mg Subcutaneous Q24H Katha HammingKonidena, Snehalatha, MD      . haloperidol (HALDOL) tablet 0.5 mg  0.5 mg Oral Q8H PRN Katha HammingKonidena, Snehalatha, MD      . haloperidol lactate (HALDOL) injection 1 mg  1 mg Intravenous Q8H PRN Katha HammingKonidena, Snehalatha, MD   1 mg at 05/19/18 1633  . hydrALAZINE (APRESOLINE)  injection 10 mg  10 mg Intravenous Q6H PRN Katha HammingKonidena, Snehalatha, MD   10 mg at 05/21/18 0838  . levothyroxine (SYNTHROID, LEVOTHROID) tablet 100 mcg  100 mcg Oral QAC breakfast Ihor AustinPyreddy, Pavan, MD   100 mcg at 05/20/18 0744  . lisinopril (PRINIVIL,ZESTRIL) tablet 10 mg  10 mg Oral Daily Eula ListenDunn, Ryan M, PA-C   10 mg at 05/21/18 1009  . Melatonin TABS 10 mg  10 mg Oral QHS PRN Ihor AustinPyreddy, Pavan, MD   10 mg at 05/20/18 2121  . metoprolol tartrate (LOPRESSOR) tablet 25 mg  25 mg Oral BID Rollene RotundaHochrein, James, MD   25 mg at 05/21/18 1009  . nitroGLYCERIN (NITROSTAT) SL tablet 0.4 mg  0.4 mg Sublingual Q5 Min x 3 PRN Pyreddy, Vivien RotaPavan, MD      . OLANZapine (ZYPREXA) injection 2.5 mg  2.5 mg Intramuscular Once Barbaraann RondoSridharan, Prasanna, MD       . ondansetron (ZOFRAN) injection 4 mg  4 mg Intravenous Q6H PRN Pyreddy, Pavan, MD      . polyethylene glycol (MIRALAX / GLYCOLAX) packet 17 g  17 g Oral Daily Katha HammingKonidena, Snehalatha, MD   17 g at 05/21/18 1100  . sodium chloride flush (NS) 0.9 % injection 3 mL  3 mL Intravenous Q12H Pyreddy, Pavan, MD   3 mL at 05/19/18 1002  . sodium chloride flush (NS) 0.9 % injection 3 mL  3 mL Intravenous PRN Pyreddy, Pavan, MD      . vitamin B-12 (CYANOCOBALAMIN) tablet 50 mcg  50 mcg Oral Daily Pyreddy, Vivien RotaPavan, MD   50 mcg at 05/20/18 1019  . vitamin C (ASCORBIC ACID) tablet 500 mg  500 mg Oral Daily Pyreddy, Pavan, MD   500 mg at 05/20/18 1019  . vitamin E capsule 100 Units  100 Units Oral Daily Ihor AustinPyreddy, Pavan, MD   100 Units at 05/21/18 1009     Discharge Medications: Please see discharge summary for a list of discharge medications.  Relevant Imaging Results:  Relevant Lab Results:   Additional Information (SSN: [id number])  Tacy Chavis, Darleen CrockerBailey M, LCSW

## 2018-05-21 NOTE — Progress Notes (Signed)
PT is recommending SNF. Clinical Child psychotherapistocial Worker (CSW) started [facility] authorization through [employer] and faxed out FL2 at patient's daughter in law Trish's request. CSW will follow up with Rosann Auerbachrish and patient's son Lorin PicketScott tomorrow with bed offers. Per Trish patient is still confused and has no HPOA. CSW will continue to follow and assist as needed.   [employer]Bailey Rylei Codispoti, LCSW [phone]

## 2018-05-21 NOTE — Progress Notes (Signed)
[facility] [place] at [facility]   PATIENT NAME: Debra Bartlett    MR#:  [id number]  DATE OF BIRTH:  05/29/1925  Patient is pleasantly demented.  Had a fall yesterday but hip x-rays did not show any fracture.  Blood pressure medication have been adjusted by cardiology.  Blood pressure is still high but better now.  CHIEF COMPLAINT:   Chief Complaint  Patient presents with  . Weakness  Patient receiving PRN Haldol, received Risperdal last night, she managed to get on the elevator as per sitter.  So we have a sitter for safety. REVIEW OF SYSTEMS:  Patient slightly confused but able to tell me about her symptoms Review of Systems  Constitutional: Negative for chills and fever.  HENT: Negative for hearing loss.   Eyes: Negative for blurred vision, double vision and photophobia.  Respiratory: Negative for cough, hemoptysis and shortness of breath.   Cardiovascular: Negative for palpitations, orthopnea and leg swelling.  Gastrointestinal: Negative for abdominal pain, diarrhea and vomiting.  Genitourinary: Negative for dysuria and urgency.  Musculoskeletal: Negative for myalgias and neck pain.  Skin: Negative for rash.  Neurological: Negative for dizziness, focal weakness, seizures, weakness and headaches.  Psychiatric/Behavioral: Positive for memory loss. The patient does not have insomnia.     Nutrition: N.p.o. Tolerating Diet: Tolerating PT:      DRUG ALLERGIES:  No Known Allergies  VITALS:  Blood pressure (!) 181/58, pulse (!) 56, temperature 98 F (36.7 C), temperature source Oral, resp. rate 16, height 5\' 6"  (1.676 m), weight 57.1 kg, SpO2 98 %.  PHYSICAL EXAMINATION:   Physical Exam  GENERAL:  84 y.o.-year-old patient lying in the bed with no acute distress.  Trying to pull out telemetry wires and also removed heparin drip. EYES: Pupils equal, round, reactive to light . No scleral icterus. Extraocular muscles intact.  HEENT: Head atraumatic,  normocephalic. Oropharynx and nasopharynx clear.  NECK:  Supple, no jugular venous distention. No thyroid enlargement, no tenderness.  LUNGS: Normal breath sounds bilaterally, no wheezing, rales,rhonchi or crepitation. No use of accessory muscles of respiration.  CARDIOVASCULAR: S1, S2 normal.  Has ejection systolic murmur in aortic area.  ABDOMEN: Soft, nontender, nondistended. Bowel sounds present. No organomegaly or mass.  EXTREMITIES: No pedal edema, cyanosis, or clubbing.  NEUROLOGIC: Cranial nerves II through XII are intact. Muscle strength 5/5 in all extremities. Sensation intact. Gait not checked.  Is able to lift hands, legs against gravity.   PSYCHIATRIC:  she is confused. LABORATORY PANEL:   CBC Recent Labs  Lab 05/20/18 0717  WBC 8.1  HGB 12.8  HCT 37.6  PLT 144*   ------------------------------------------------------------------------------------------------------------------  Chemistries  Recent Labs  Lab 05/18/18 1558 05/19/18 0659  NA 142 143  K 4.3 3.9  CL 110 110  CO2 29 27  GLUCOSE 93 96  BUN 24* 22  CREATININE 0.82 0.66  CALCIUM 9.3 9.2  AST 27  --   ALT 15  --   ALKPHOS 67  --   BILITOT 0.7  --    ------------------------------------------------------------------------------------------------------------------  Cardiac Enzymes Recent Labs  Lab 05/19/18 0659  TROPONINI 0.27*   ------------------------------------------------------------------------------------------------------------------  RADIOLOGY:  Dg Hip Unilat With Pelvis 2-3 Views Left  Result Date: 05/20/2018 CLINICAL DATA:  Fall, LEFT hip pain. EXAM: DG HIP (WITH OR WITHOUT PELVIS) 2-3V LEFT COMPARISON:  LEFT hip radiograph August 28, 2017 and CTA abdomen and pelvis October 16, 2017. FINDINGS: There is no evidence of hip fracture or dislocation. Similar crescentic capsular calcification.  Osteopenia. There is no evidence of arthropathy or other focal bone abnormality. Phleboliths  project in the pelvis. Aortoiliac stent graft with calcified large aneurysmal sac. IMPRESSION: 1. No fracture deformity or dislocation. No advanced degenerative change for age. Electronically Signed   By: Awilda Metroourtnay  Bloomer M.D.   On: 05/20/2018 16:49   Dg Hip Unilat With Pelvis 2-3 Views Right  Result Date: 05/20/2018 CLINICAL DATA:  Acute RIGHT hip pain following fall. Initial encounter. EXAM: DG HIP (WITH OR WITHOUT PELVIS) 2-3V RIGHT COMPARISON:  08/28/2017 radiographs FINDINGS: No acute fracture, subluxation or dislocation. Mild degenerative changes in the hips noted. No suspicious focal bony lesions identified. IMPRESSION: No acute abnormality. Electronically Signed   By: Harmon PierJeffrey  Hu M.D.   On: 05/20/2018 16:51     ASSESSMENT AND PLAN:   Principal Problem:   Accelerated hypertension  #441.84 year old female patient with left facial droop, left-sided leaning initial CT head unremarkable.  Admitted to telemetry because of elevated troponins.  P repeat CT head did not show acute changes.  Patient has extensive atrophy more prominent and temporal lobes, hippocampus.  Echocardiogram showed EF 60% with no regional wall motion abnormality.  Severe LVH, severe pulmonary hypertension with severe aortic stenosis.  Ultrasound of carotids showed no hemodynamically significant stenosis. Patient has advanced dementia and needs placement.  Unable to care for self at home, no family support to take care of her.   2.  Slightly elevated troponins, non-ST elevation MI, patient has no chest pain and has baseline dementia and some confusion and pulling out telemetry wires, stopped  heparin drip, continue Lovenox for DVT prophylaxis.  3.  Advanced dementia,  #4. malignant hypertension: Patient is on amlodipine 5 mg p.o. daily, Toprol 5 mg p.o. daily, metoprolol 25 mg p.o. twice daily, .  Watch closely on monitor.   Continue safety sister as per social worker recommendation All the records are reviewed and case  discussed with Care Management/Social Workerr. Management plans discussed with the patient, family and they are in agreement.  CODE STATUS: Full code TOTAL TIME TAKING CARE OF THIS PATIENT: 38 minutes.   POSSIBLE D/C IN 1-2 DAYS, DEPENDING ON CLINICAL CONDITION.   Katha HammingSnehalatha Markham Dumlao M.D on 05/21/2018 at 2:11 PM  Between 7am to 6pm - Pager - [phone]  After 6pm go to [email or web address] - password [email or web address] [facility]  [facility]  Office  [phone]  CC: Primary care physician; Jaclyn Shaggyate, Denny C, MD

## 2018-05-22 MED ORDER — HYDRALAZINE HCL 25 MG PO TABS
25.0000 mg | ORAL_TABLET | Freq: Three times a day (TID) | ORAL | Status: DC
  Administered 2018-05-22 – 2018-05-23 (×3): 25 mg via ORAL
  Filled 2018-05-22 (×3): qty 1

## 2018-05-22 MED ORDER — OLANZAPINE 10 MG IM SOLR
5.0000 mg | Freq: Once | INTRAMUSCULAR | Status: AC
  Administered 2018-05-22: 5 mg via INTRAMUSCULAR
  Filled 2018-05-22: qty 10

## 2018-05-22 NOTE — Progress Notes (Signed)
Clinical Child psychotherapistocial Worker (CSW) presented bed offers to patient's daughter in Advice workerlaw Trish. She chose [facility]. Per MD patient still has a Comptrollersitter. CSW made MD aware that patient has to be 24 hours without a sitter prior to D/C to [facility]. Tammy admissions coordinator at [facility] is aware of above. [employer] SNF authorization is pending. CSW will continue to follow and assist as needed.   [employer]Bailey Kestrel Mis, LCSW [phone]

## 2018-05-22 NOTE — Clinical Social Work Placement (Signed)
   CLINICAL SOCIAL WORK PLACEMENT  NOTE  Date:  05/22/2018  Patient Details  Name: Ballard Russellmma R Damewood MRN: [id number] Date of Birth: 08/02/1925  Clinical Social Work is seeking post-discharge placement for this patient at the Skilled  Nursing Facility level of care (*CSW will initial, date and re-position this form in  chart as items are completed):  Yes   Patient/family provided with [facility] Clinical Social Work Department's list of facilities offering this level of care within the geographic area requested by the patient (or if unable, by the patient's family).  Yes   Patient/family informed of their freedom to choose among providers that offer the needed level of care, that participate in Medicare, Medicaid or managed care program needed by the patient, have an available bed and are willing to accept the patient.  Yes   Patient/family informed of 's ownership interest in [facility] and [facility], as well as of the fact that they are under no obligation to receive care at these facilities.  PASRR submitted to EDS on       PASRR number received on       Existing PASRR number confirmed on 05/21/18     FL2 transmitted to all facilities in geographic area requested by pt/family on 05/21/18     FL2 transmitted to all facilities within larger geographic area on       Patient informed that his/her managed care company has contracts with or will negotiate with certain facilities, including the following:        Yes   Patient/family informed of bed offers received.  Patient chooses bed at ([facility] )     Physician recommends and patient chooses bed at      Patient to be transferred to   on  .  Patient to be transferred to facility by       Patient family notified on   of transfer.  Name of family member notified:        PHYSICIAN       Additional Comment:    _______________________________________________ Daliya Parchment, Darleen CrockerBailey M, LCSW 05/22/2018, 10:24 AM

## 2018-05-22 NOTE — Progress Notes (Signed)
[facility] [place] [facility]   PATIENT NAME: Debra Bartlett    MR#:  [id number]  DATE OF BIRTH:  04/14/1925  Patient had a rough night last night very agitated, aggressive and threatening staff with violence.  Received Zyprexa.  This morning alert.  Blood pressure still very high.  CHIEF COMPLAINT:   Chief Complaint  Patient presents with  . Weakness  Patient receiving PRN Haldol, received Risperdal last night, she managed to get on the elevator as per sitter.  So we have a sitter for safety. REVIEW OF SYSTEMS:  Patient slightly confused but able to tell me about her symptoms Review of Systems  Constitutional: Negative for chills and fever.  HENT: Negative for hearing loss.   Eyes: Negative for blurred vision, double vision and photophobia.  Respiratory: Negative for cough, hemoptysis and shortness of breath.   Cardiovascular: Negative for palpitations, orthopnea and leg swelling.  Gastrointestinal: Negative for abdominal pain, diarrhea and vomiting.  Genitourinary: Negative for dysuria and urgency.  Musculoskeletal: Negative for myalgias and neck pain.  Skin: Negative for rash.  Neurological: Negative for dizziness, focal weakness, seizures, weakness and headaches.  Psychiatric/Behavioral: Positive for memory loss. The patient does not have insomnia.     Nutrition: N.p.o. Tolerating Diet: Tolerating PT:      DRUG ALLERGIES:  No Known Allergies  VITALS:  Blood pressure (!) 213/82, pulse 70, temperature 98.3 F (36.8 C), temperature source Oral, resp. rate 16, height 5\' 6"  (1.676 m), weight 57.1 kg, SpO2 100 %.  PHYSICAL EXAMINATION:   Physical Exam  GENERAL:  84 y.o.-year-old patient lying in the bed with no acute distress.  Trying to pull out telemetry wires and also removed heparin drip. EYES: Pupils equal, round, reactive to light . No scleral icterus. Extraocular muscles intact.  HEENT: Head atraumatic, normocephalic. Oropharynx and  nasopharynx clear.  NECK:  Supple, no jugular venous distention. No thyroid enlargement, no tenderness.  LUNGS: Normal breath sounds bilaterally, no wheezing, rales,rhonchi or crepitation. No use of accessory muscles of respiration.  CARDIOVASCULAR: S1, S2 normal.  Has ejection systolic murmur in aortic area.  ABDOMEN: Soft, nontender, nondistended. Bowel sounds present. No organomegaly or mass.  EXTREMITIES: No pedal edema, cyanosis, or clubbing.  NEUROLOGIC: Cranial nerves II through XII are intact. Muscle strength 5/5 in all extremities. Sensation intact. Gait not checked.  Is able to lift hands, legs against gravity.   PSYCHIATRIC:  she is confused. LABORATORY PANEL:   CBC Recent Labs  Lab 05/20/18 0717  WBC 8.1  HGB 12.8  HCT 37.6  PLT 144*   ------------------------------------------------------------------------------------------------------------------  Chemistries  Recent Labs  Lab 05/18/18 1558 05/19/18 0659  NA 142 143  K 4.3 3.9  CL 110 110  CO2 29 27  GLUCOSE 93 96  BUN 24* 22  CREATININE 0.82 0.66  CALCIUM 9.3 9.2  AST 27  --   ALT 15  --   ALKPHOS 67  --   BILITOT 0.7  --    ------------------------------------------------------------------------------------------------------------------  Cardiac Enzymes Recent Labs  Lab 05/19/18 0659  TROPONINI 0.27*   ------------------------------------------------------------------------------------------------------------------  RADIOLOGY:  Dg Hip Unilat With Pelvis 2-3 Views Left  Result Date: 05/20/2018 CLINICAL DATA:  Fall, LEFT hip pain. EXAM: DG HIP (WITH OR WITHOUT PELVIS) 2-3V LEFT COMPARISON:  LEFT hip radiograph August 28, 2017 and CTA abdomen and pelvis October 16, 2017. FINDINGS: There is no evidence of hip fracture or dislocation. Similar crescentic capsular calcification. Osteopenia. There is no evidence of arthropathy or  other focal bone abnormality. Phleboliths project in the pelvis. 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 IMPRESSION: 1. No fracture deformity or dislocation. No advanced degenerative change for age. Electronically Signed   By: Awilda Metroourtnay  Bloomer M.D.   On: 05/20/2018 16:49   Dg Hip Unilat With Pelvis 2-3 Views Right  Result Date: 05/20/2018 CLINICAL DATA:  Acute RIGHT hip pain following fall. Initial encounter. EXAM: DG HIP (WITH OR WITHOUT PELVIS) 2-3V RIGHT COMPARISON:  08/28/2017 radiographs FINDINGS: No acute fracture, subluxation or dislocation. Mild degenerative changes in the hips noted. No suspicious focal bony lesions identified. IMPRESSION: No acute abnormality. Electronically Signed   By: Harmon PierJeffrey  Hu M.D.   On: 05/20/2018 16:51     ASSESSMENT AND PLAN:   Principal Problem:   Accelerated hypertension Active Problems:   Non-STEMI (non-ST elevated myocardial infarction) [facility](HCC)  #221.84 year old female patient with left facial droop, left-sided leaning initial CT head unremarkable.  Admitted to telemetry because of elevated troponins.  P repeat CT head did not show acute changes.  Patient has extensive atrophy more prominent and temporal lobes, hippocampus.  Echocardiogram showed EF 60% with no regional wall motion abnormality.  Severe LVH, severe pulmonary hypertension with severe aortic stenosis.  Ultrasound of carotids showed no hemodynamically significant stenosis. Patient has advanced dementia and needs placement.  Unable to care for self at home, no family support to take care of her.   2.  Slightly elevated troponins, non-ST elevation MI, patient has no chest pain and has baseline dementia and some confusion and pulling out telemetry wires, stopped  heparin drip, continue Lovenox for DVT prophylaxis.  3.  Advanced dementia,  #4. malignant hypertension: Patient is on amlodipine 5 mg p.o. daily,, metoprolol, blood pressure is still very elevated, add hydralazine, unable to discharge today because of malignant hypertension.    Will get  psychiatric consult as well because of her agitation, aggression last night..  Watch closely on monitor. Discontinued sitter as per Child psychotherapistsocial worker recommendation yesterday.  All the records are reviewed and case discussed with Care Management/Social Workerr. Management plans discussed with the patient, family and they are in agreement.  CODE STATUS: Full code TOTAL TIME TAKING CARE OF THIS PATIENT: 38 minutes.   POSSIBLE D/C IN 1-2 DAYS, DEPENDING ON CLINICAL CONDITION.   Katha HammingSnehalatha Goldye Tourangeau M.D on 05/22/2018 at 12:13 PM  Between 7am to 6pm - Pager - [phone]  After 6pm go to [email or web address] - password [email or web address] [facility]  [facility]  Office  [phone]  CC: Primary care physician; Jaclyn Shaggyate, Denny C, MD

## 2018-05-22 NOTE — Progress Notes (Addendum)
Per RN and MD patient's sitter was discontinued yesterday. [employer] authorization has been received, authorization # [id number], RVA. Authorization is good through Thursday 05/24/18. Patient can D/C to [facility] when medically stable. Tammy admissions coordinator at [facility] is aware of above. Patient's daughter in law Rosann Auerbachrish is aware of above. CSW will continue to follow and assist as needed.   [employer]Bailey Damyah Gugel, LCSW [phone]
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
## 2018-05-23 MED ORDER — ATORVASTATIN CALCIUM 40 MG PO TABS: 40.0000 mg | ORAL_TABLET | Freq: Every day | ORAL | 0 refills | Status: AC

## 2018-05-23 MED ORDER — HYDRALAZINE HCL 25 MG PO TABS: 25.0000 mg | ORAL_TABLET | Freq: Three times a day (TID) | ORAL | 0 refills | Status: AC

## 2018-05-23 MED ORDER — METOPROLOL TARTRATE 25 MG PO TABS: 25.0000 mg | ORAL_TABLET | Freq: Two times a day (BID) | ORAL | 0 refills | Status: AC

## 2018-05-23 MED ORDER — RISPERIDONE 1 MG PO TABS: 1.0000 mg | ORAL_TABLET | Freq: Two times a day (BID) | ORAL | 0 refills | Status: AC

## 2018-05-23 MED ORDER — LISINOPRIL 10 MG PO TABS: 10.0000 mg | ORAL_TABLET | Freq: Every day | ORAL | 0 refills | Status: AC

## 2018-05-23 NOTE — Progress Notes (Signed)
MD aware of BP 186/62 / ok to discharge / EMS arrived to transport

## 2018-05-23 NOTE — Clinical Social Work Placement (Signed)
   CLINICAL SOCIAL WORK PLACEMENT  NOTE  Date:  05/23/2018  Patient Details  Name: Debra Bartlett MRN: [id number] Date of Birth: 06/01/1925  Clinical Social Work is seeking post-discharge placement for this patient at the Skilled  Nursing Facility level of care (*CSW will initial, date and re-position this form in  chart as items are completed):  Yes   Patient/family provided with [place] Clinical Social Work Department's list of facilities offering this level of care within the geographic area requested by the patient (or if unable, by the patient's family).  Yes   Patient/family informed of their freedom to choose among providers that offer the needed level of care, that participate in Medicare, Medicaid or managed care program needed by the patient, have an available bed and are willing to accept the patient.  Yes   Patient/family informed of [facility]'s ownership interest in [facility] and [facility], as well as of the fact that they are under no obligation to receive care at these facilities.  PASRR submitted to EDS on       PASRR number received on       Existing PASRR number confirmed on 05/21/18     FL2 transmitted to all facilities in geographic area requested by pt/family on 05/21/18     FL2 transmitted to all facilities within larger geographic area on       Patient informed that his/her managed care company has contracts with or will negotiate with certain facilities, including the following:        Yes   Patient/family informed of bed offers received.  Patient chooses bed at ([facility] )     Physician recommends and patient chooses bed at      Patient to be transferred to ([facility] ) on 05/23/18.  Patient to be transferred to facility by [facility] )     Patient family notified on 05/23/18 of transfer.  Name of family member notified:  (Patient's significant other Billey GoslingCharlie and daughter in law Rosann Auerbachrish are aware of D/C today. )     PHYSICIAN        Additional Comment:    _______________________________________________ Algis Lehenbauer, Darleen CrockerBailey M, LCSW 05/23/2018, 10:04 AM

## 2018-05-23 NOTE — Progress Notes (Signed)
Patient's BP still elevated after PO hydralazine. MD notified and wanted norvasc given early.  Trudee KusterBrandi R Mansfield

## 2018-05-23 NOTE — Progress Notes (Signed)
Patient is medically stable for D/C to [facility] today. Per Tammy admissions coordinator at [facility] patient can come today to room 801. RN will call report and arrange EMS for transport. Outpatient palliative care will follow, [facility] aware of above. Clinical Child psychotherapistocial Worker (CSW) sent D/C orders to [facility] via [facility]. Patient is aware of above. Patient's significant other Billey GoslingCharlie was at bedside and aware of above. CSW contacted patient's daughter in law Rosann Auerbachrish and made her aware of above. Please reconsult if future social work needs arise. CSW signing off.   [employer]iley Ciria Bernardini, LCSW [phone]

## 2018-05-23 NOTE — Discharge Summary (Addendum)
Debra Bartlett, is a 84 y.o. female  DOB 1925-03-12  MRN [id number].  Admission date:  05/18/2018  Admitting Physician  Ihor Austin, MD  Discharge Date:  05/23/2018   Primary MD  Jaclyn Shaggy, MD  Recommendations for primary care physician for things to follow:   Discharge to [facility]  Pall[facility] care to be followed at [facility]. Admission Diagnosis  NSTEMI (non-ST elevated myocardial infarction) [facility]) [I21.4]   Discharge Diagnosis  NSTEMI (non-ST elevated myocardial infarction) (HCC) [I21.4]   Principal Problem:   Accelerated hypertension Active Problems:   Non-STEMI (non-ST elevated myocardial infarction) [facility])      Past Medical History:  Diagnosis Date  . Abdominal aortic aneurysm (AAA) (HCC) 2019  . Aortic stenosis   . Hypertension   . Hypothyroidism     Past Surgical History:  Procedure Laterality Date  . ENDOVASCULAR REPAIR/STENT GRAFT N/A 11/08/2017   Procedure: ENDOVASCULAR REPAIR/STENT GRAFT;  Surgeon: Annice Needy, MD;  Location: [facility] INVASIVE CV LAB;  Service: Cardiovascular;  Laterality: N/A;  . TONSILLECTOMY         History of present illness and  Hospital Course:     Kindly see H&P for history of present illness and admission details, please review complete Labs, Consult reports and Test reports for all details in brief  HPI  from the history and physical done on the day of admission 84 year old female patient admitted because of left facial droop initially thought she had a stroke so she is admitted for stroke evaluation but later on found to have elevated troponin so she is admitted to telemetry started on heparin drip.   Hospital Course  #1 non-ST elevation MI, patient has Alzheimer's dementia unable to tell that that she is starting in the chest, EKG did not show acute changes,  seen by cardiology, initially required heparin drip but patient was very agitated and removing all the wires and IV lock so discontinue the heparin drip, obtain safety sister, continued on DVT prophylaxis with Lovenox.  Elevated troponins are due to supply demand ischemia in the setting of uncontrolled hypertension.  Patient also has underlying severe aortic stenosis.  Cardiology is following the patient and recommended no invasive cardiac work-up because of her advanced age and her severe dementia. 2.  Hypertensive emergency, patient blood pressure very elevated around 200/110. Patient is on Lopressor 25 mg p.o. twice daily, lisinopril 10 mg daily, Norvasc 5 mg p.o. daily, added hydralazine 25 mg p.o. 3 times daily for better blood pressure control.  Blood pressure is better morning around one 170/64. 3.  Severe pulmonary hypertension, aortic stenosis.  No shortness of breath no hypoxia noted.  Patient is not a candidate for invasive procedure secondary to advanced age, medical management.  Patient can get Revatio down the road as per cardiology but she has advanced age, Alzheimer's dementia. 4.  CODE STATUS discussed with patient's daughter-in-law, patient has no point healthcare power of attorney and now she has advanced dementia I recommended that she needs to be full DNR due to her overall medical condition and advanced age.  Patient's son is in the process of getting healthcare power of attorney papers. Family requested rehab placement because of her dementia, nobody to take care of her at home patient is [facility].  Alzheimer's dementia, agitation, initially requested safety sitter, patient had a fall in the hospital, x-rays for done for both hips which did not show any fracture patient landed on the hip.  SafetySitter is discontinued as per recommendation from social worker to help her with SNF placement.  At this time a patient is cooperative.   Discharge Condition:  Stable   Follow UP  Contact information for after-discharge care    Destination    [facility] Preferred [facility]F .   Service:  Skilled Nursing Contact information: [address] [place] [id number] [phone]                Discharge Instructions  and  Discharge Medications      Allergies as of 05/23/2018   No Known Allergies     Medication List    STOP taking these medications   ST JOHNS WORT PO     TAKE these medications   acetaminophen 325 MG tablet Commonly known as:  TYLENOL Take 325 mg by mouth every 6 (six) hours as needed for moderate pain or headache.   amLODipine 5 MG tablet Commonly known as:  NORVASC Take 1 tablet (5 mg total) by mouth daily.   aspirin EC 81 MG tablet Take 1 tablet (81 mg total) by mouth daily.   atorvastatin 40 MG tablet Commonly known as:  LIPITOR Take 1 tablet (40 mg total) by mouth daily at 6 PM.   BILBERRY PO Take 1 tablet by mouth daily.   CALCIUM-VITAMIN D3 PO Take 1 tablet by mouth daily.   hydrALAZINE 25 MG tablet Commonly known as:  APRESOLINE Take 1 tablet (25 mg total) by mouth every 8 (eight) hours.   levothyroxine 100 MCG tablet Commonly known as:  SYNTHROID, LEVOTHROID Take 100 mcg by mouth daily.   lisinopril 10 MG tablet Commonly known as:  PRINIVIL,ZESTRIL Take 1 tablet (10 mg total) by mouth daily. What changed:    medication strength  how much to take   Melatonin 10 MG Caps Take 10 mg by mouth at bedtime as needed (for sleep).   metoprolol tartrate 25 MG tablet Commonly known as:  LOPRESSOR Take 1 tablet (25 mg total) by mouth 2 (two) times daily.   RA EAR DROPS HOMEOPATHIC OT Place 2 drops into both ears daily as needed (for stopped up ear).   risperiDONE 1 MG tablet Commonly known as:  RISPERDAL Take 1 tablet (1 mg total) by mouth 2 (two) times daily.   VITAMIN B-12 PO Take 1 tablet by mouth daily.   VITAMIN C PO Take 1 tablet by mouth daily.    VITAMIN D3 PO Take 1 capsule by mouth daily.   VITAMIN E PO Take 1 capsule by mouth daily.         Diet and Activity recommendation: See Discharge Instructions above   Consults obtained -cardiology   Major procedures and Radiology Reports - PLEASE review detailed and final reports for all details, in brief -      Ct Head Wo Contrast  Result Date: 05/19/2018 CLINICAL DATA:  Encephalopathy. Dementia. Increased confusion today EXAM: CT HEAD WITHOUT CONTRAST TECHNIQUE: Contiguous axial images were obtained from the base of the skull through the vertex without intravenous contrast. COMPARISON:  CT head 05/18/2018 FINDINGS: Brain: Advanced atrophy most severe in the temporal lobe with marked enlargement of the temporal horns, unchanged from prior studies. Extensive chronic white matter changes. No acute infarct, hemorrhage, mass. No midline shift. Vascular: Negative for hyperdense vessel Skull: Negative for fracture Sinuses/Orbits: Air-fluid level left sphenoid sinus. Bilateral cataract surgery Other: None IMPRESSION: Extensive atrophy most prominent in the temporal lobes and hippocampus compatible with dementia. Chronic microvascular  ischemia.  No acute abnormality. Electronically Signed   By: Marlan Palau M.D.   On: 05/19/2018 12:36   Ct Head Wo Contrast  Result Date: 05/18/2018 CLINICAL DATA:  Pain following fall EXAM: CT HEAD WITHOUT CONTRAST TECHNIQUE: Contiguous axial images were obtained from the base of the skull through the vertex without intravenous contrast. COMPARISON:  May 18, 2018 study obtained earlier in the day FINDINGS: Brain: There is moderate diffuse atrophy. There is no intracranial mass, hemorrhage, extra-axial fluid collection, or midline shift. Patchy small vessel disease in the centra semiovale bilaterally is stable. Elsewhere gray-white compartments appear normal. No acute infarct evident. Vascular: No hyperdense vessel. There is calcification in each carotid  siphon region. Skull: The bony calvarium appears intact. Sinuses/Orbits: There is opacification in the left sphenoid sinus. There is mucosal thickening in several ethmoid air cells. Other visualized paranasal sinuses are clear. Orbits appear symmetric bilaterally. Other: Mastoid air cells are opacified diffusely on the left. Mastoids on the right are clear. IMPRESSION: 1. Atrophy with supratentorial small vessel disease. No acute infarct. No mass or hemorrhage. No extra-axial fluid. 2.  Foci of arterial vascular calcification. 3. Diffuse left-sided mastoid air cell disease. Areas of paranasal sinus disease, most notably in the left sphenoid sinus. Electronically Signed   By: Bretta Bang III M.D.   On: 05/18/2018 19:34   Ct Head Wo Contrast  Result Date: 05/18/2018 CLINICAL DATA:  Facial weakness. Pt comes into the ED via [facility] [place] station where they found the patient to be leaning to the left with a left facial droop. Patient denies any pain or symptoms at this time. EXAM: CT HEAD WITHOUT CONTRAST TECHNIQUE: Contiguous axial images were obtained from the base of the skull through the vertex without intravenous contrast. COMPARISON:  02/21/2018 and older studies. FINDINGS: Brain: No evidence of acute infarction, hemorrhage, hydrocephalus, extra-axial collection or mass lesion/mass effect. There is ventricular and sulcal enlargement reflecting moderate generalized atrophy stable from prior study. Patchy areas of white matter hypoattenuation are also noted consistent with moderate chronic microvascular ischemic change, also stable. Vascular: No hyperdense vessel or unexpected calcification. Skull: Normal. Negative for fracture or focal lesion. Sinuses/Orbits: Visualize globes and orbits are unremarkable. Dependent secretions in the left sphenoid sinus. Remaining visualized sinuses are clear. Other: None. IMPRESSION: 1. No acute intracranial abnormalities. 2. Atrophy and chronic microvascular ischemic  change stable from the prior study. Electronically Signed   By: Amie Portland M.D.   On: 05/18/2018 16:09   US Carotid Bilateral  Result Date: 05/19/2018 CLINICAL DATA:  Stroke symptoms, hypertension, hyperlipidemia and tobacco use EXAM: BILATERAL CAROTID DUPLEX ULTRASOUND TECHNIQUE: Wallace Cullens scale imaging, color Doppler and duplex ultrasound were performed of bilateral carotid and vertebral arteries in the neck. COMPARISON:  02/21/2018 FINDINGS: Criteria: Quantification of carotid stenosis is based on velocity parameters that correlate the residual internal carotid diameter with NASCET-based stenosis levels, using the diameter of the distal internal carotid lumen as the denominator for stenosis measurement. The following velocity measurements were obtained: RIGHT ICA: 89/12 cm/sec CCA: 66/10 cm/sec SYSTOLIC ICA/CCA RATIO:  1.3 ECA: 84 cm/sec LEFT ICA: 105/17 cm/sec CCA: 76/10 cm/sec SYSTOLIC ICA/CCA RATIO:  1.4 ECA: 106 cm/sec RIGHT CAROTID ARTERY: Minor echogenic shadowing plaque formation. No hemodynamically significant right ICA stenosis, velocity elevation, or turbulent flow. Degree of narrowing less than 50%. RIGHT VERTEBRAL ARTERY:  Antegrade LEFT CAROTID ARTERY: Similar scattered minor echogenic plaque formation. No hemodynamically significant left ICA stenosis, velocity elevation, or turbulent flow. LEFT VERTEBRAL ARTERY:  Antegrade IMPRESSION: Minor carotid atherosclerosis. No hemodynamically significant ICA stenosis. Degree of narrowing less than 50% bilaterally by ultrasound criteria. Patent antegrade vertebral flow bilaterally Electronically Signed   By: Judie PetitM.  Shick M.D.   On: 05/19/2018 14:03   Dg Hip Unilat With Pelvis 2-3 Views Left  Result Date: 05/20/2018 CLINICAL DATA:  Fall, LEFT hip pain. EXAM: DG HIP (WITH OR WITHOUT PELVIS) 2-3V LEFT COMPARISON:  LEFT hip radiograph August 28, 2017 and CTA abdomen and pelvis October 16, 2017. FINDINGS: There is no evidence of hip fracture or dislocation.  Similar crescentic capsular calcification. Osteopenia. There is no evidence of arthropathy or other focal bone abnormality. Phleboliths project in the pelvis. Aortoiliac stent graft with calcified large aneurysmal sac. IMPRESSION: 1. No fracture deformity or dislocation. No advanced degenerative change for age. Electronically Signed   By: Awilda Metroourtnay  Bloomer M.D.   On: 05/20/2018 16:49   Dg Hip Unilat With Pelvis 2-3 Views Right  Result Date: 05/20/2018 CLINICAL DATA:  Acute RIGHT hip pain following fall. Initial encounter. EXAM: DG HIP (WITH OR WITHOUT PELVIS) 2-3V RIGHT COMPARISON:  08/28/2017 radiographs FINDINGS: No acute fracture, subluxation or dislocation. Mild degenerative changes in the hips noted. No suspicious focal bony lesions identified. IMPRESSION: No acute abnormality. Electronically Signed   By: Harmon PierJeffrey  Hu M.D.   On: 05/20/2018 16:51    Micro Results    Recent Results (from the past 240 hour(s))  Urine Culture     Status: None   Collection Time: 05/18/18  4:35 PM  Result Value Ref Range Status   Specimen Description   Final    URINE, RANDOM Performed at [facility], [address] [place], [place] [address]    Special Requests   Final    NONE Performed at [facility], [address] [place], [place] [address]    Culture   Final    NO GROWTH Performed at [facility], [address] [place], [place] [address]    Report Status 05/19/2018 FINAL  Final       Today   Subjective:   Debra Bartlett today h is stable for discharge.   Objective:   Blood pressure (!) 170/64, pulse 91, temperature 98.9 F (37.2 C), temperature source Oral, resp. rate 17, height 5\' 6"  (1.676 m), weight 57.1 kg, SpO2 98 %.  No intake or output data in the 24 hours ending 05/23/18 0753  Exam Awake Alert, Oriented x 3, No new F.N deficits, Normal affect [place].AT,PERRAL Supple Neck,No JVD, No cervical lymphadenopathy appriciated.  Symmetrical Chest wall movement,  Good air movement bilaterally, CTAB RRR,No Gallops,Rubs or new Murmurs, No Parasternal Heave +ve B.Sounds, Abd Soft, Non tender, No organomegaly appriciated, No rebound -guarding or rigidity. No Cyanosis, Clubbing or edema, No new Rash or bruise  Data Review   CBC w Diff:  Lab Results  Component Value Date   WBC 8.1 05/20/2018   HGB 12.8 05/20/2018   HCT 37.6 05/20/2018   PLT 144 (L) 05/20/2018   LYMPHOPCT 27 10/31/2017   MONOPCT 11 10/31/2017   EOSPCT 2 10/31/2017   BASOPCT 1 10/31/2017    CMP:  Lab Results  Component Value Date   NA 143 05/19/2018   K 3.9 05/19/2018   CL 110 05/19/2018   CO2 27 05/19/2018   BUN 22 05/19/2018   CREATININE 0.66 05/19/2018   PROT 6.4 (L) 05/18/2018   ALBUMIN 3.6 05/18/2018   BILITOT 0.7 05/18/2018   ALKPHOS 67 05/18/2018   AST 27  05/18/2018   ALT 15 05/18/2018  .   Total Time in preparing paper work, data evaluation and todays exam - 35 minutes  Katha Hamming M.D on 05/23/2018 at 7:53 AM    Note: This dictation was prepared with Dragon dictation along with smaller phrase technology. Any transcriptional errors that result from this process are unintentional.

## 2018-05-23 NOTE — Progress Notes (Signed)
Discharge report called to Kim at [facility]/ verbalized an understanding/ iv and tele removed/ EMS called to transport

## 2018-05-23 NOTE — Progress Notes (Signed)
New referral for outpatient Palliative to follow [facility] Plan is for discharge today. Patient information faxed to referral. Dayna BarkerKaren Robertson RN, BSN, [facility] [place], hospital liaison [phone]

## 2018-06-06 ENCOUNTER — Ambulatory Visit (INDEPENDENT_AMBULATORY_CARE_PROVIDER_SITE_OTHER): Payer: Medicare HMO | Admitting: Vascular Surgery

## 2018-06-06 ENCOUNTER — Other Ambulatory Visit (INDEPENDENT_AMBULATORY_CARE_PROVIDER_SITE_OTHER): Payer: Medicare HMO

## 2018-07-03 DEATH — deceased

## 2018-07-24 IMAGING — CT CT CTA ABD/PEL W/CM AND/OR W/O CM
2 of 7 series · 14 of 46 positions shown, 16 images · IV contrast (APPLIED)
Comparison: CT scan of the abdomen and pelvis 08/28/2017

CLINICAL DATA: [AGE] female with abdominal aortic aneurysm

EXAM:
CT ANGIOGRAPHY ABDOMEN AND PELVIS WITH CONTRAST AND WITHOUT CONTRAST
TECHNIQUE: Multidetector CT imaging of the abdomen and pelvis was performed
using the standard protocol during bolus administration of
intravenous contrast. Multiplanar reconstructed images and MIPs were
obtained and reviewed to evaluate the vascular anatomy.
CONTRAST:  100mL [id number] IOPAMIDOL (1DJ56V-LMV) INJECTION 76%

[Series 4: axial arterial · axial · arterial · 0.66mm/px · z∈[-812,-407]mm · 11 of 154 slices shown, 13 images]
[im 10/154  soft-tissue]
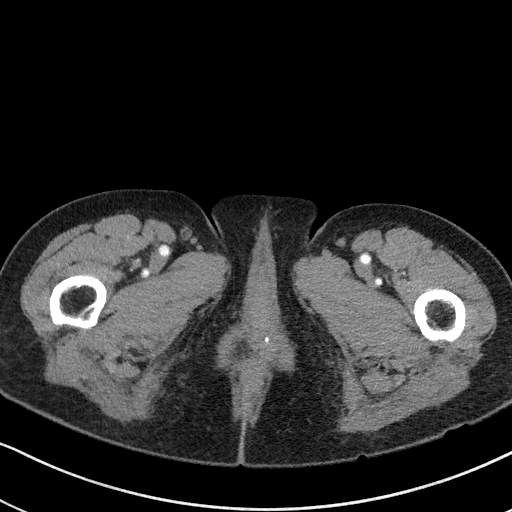
[im 10/154  bone]
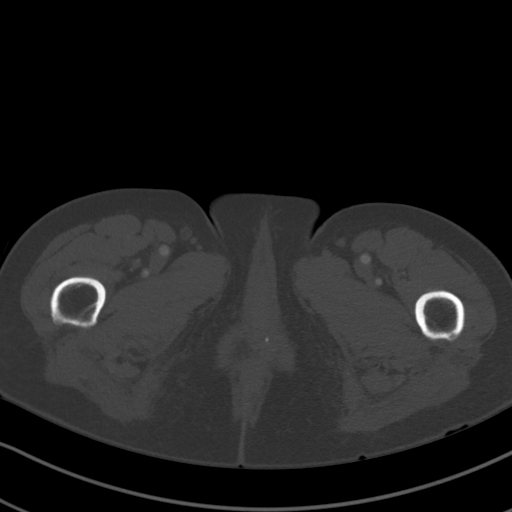
[im 28/154  soft-tissue]
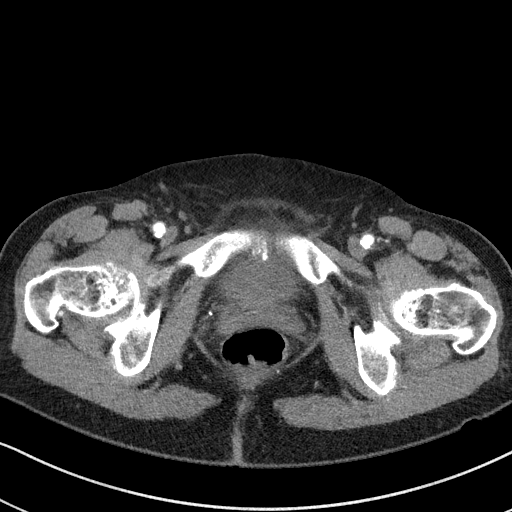
[im 37/154  soft-tissue]
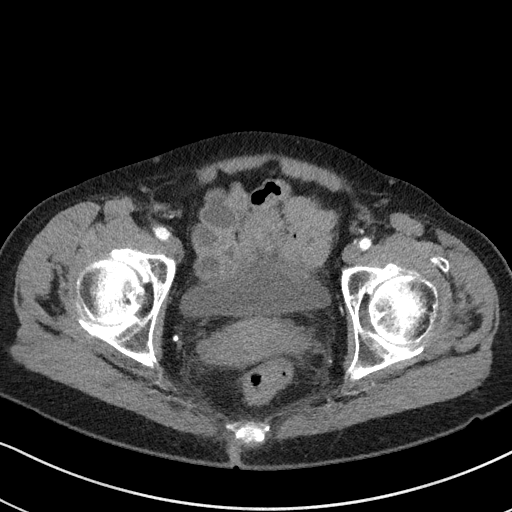
[im 55/154  soft-tissue]
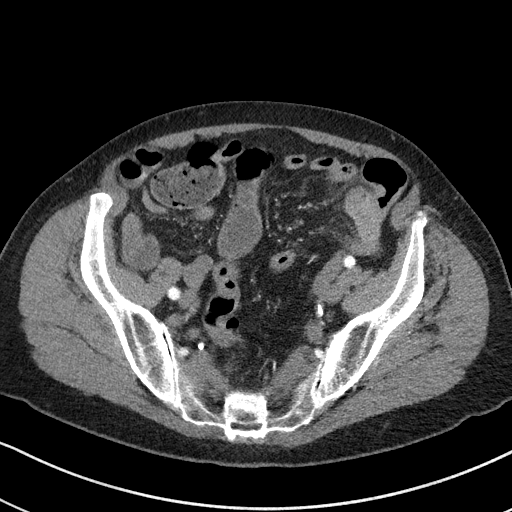
[im 64/154  soft-tissue]
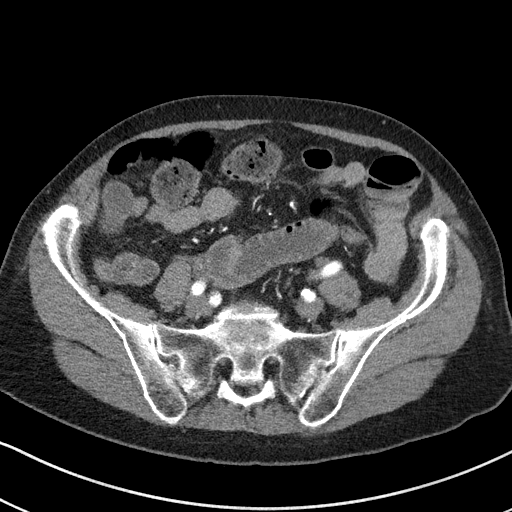
[im 82/154  soft-tissue]
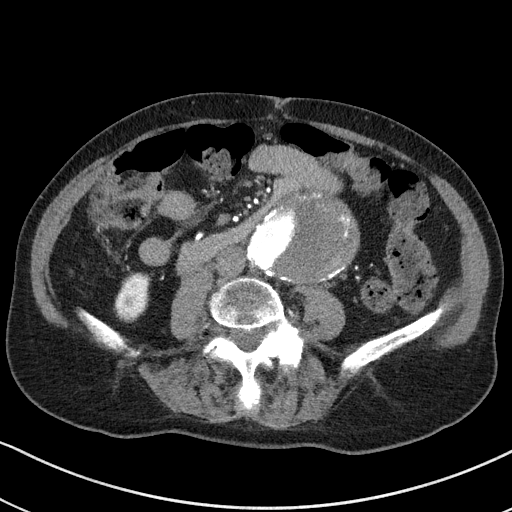
[im 91/154  soft-tissue]
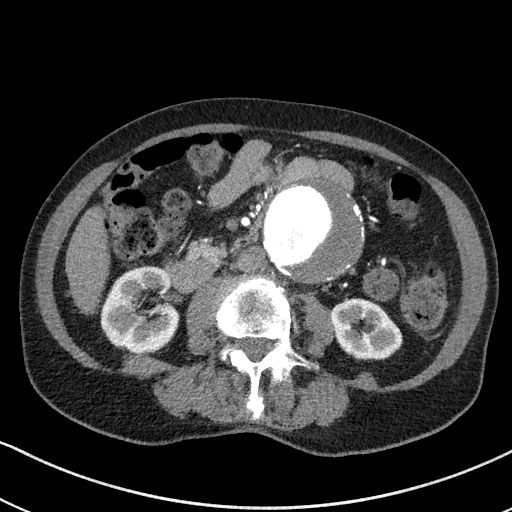
[im 100/154  soft-tissue]
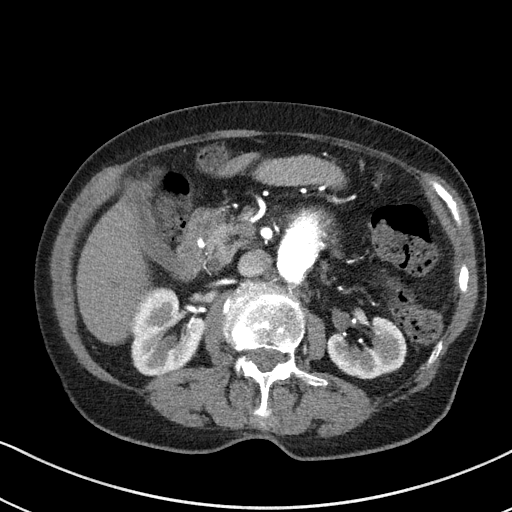
[im 118/154  soft-tissue]
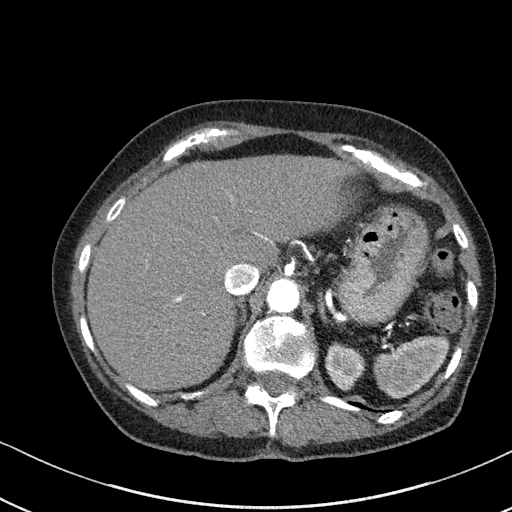
[im 118/154  bone]
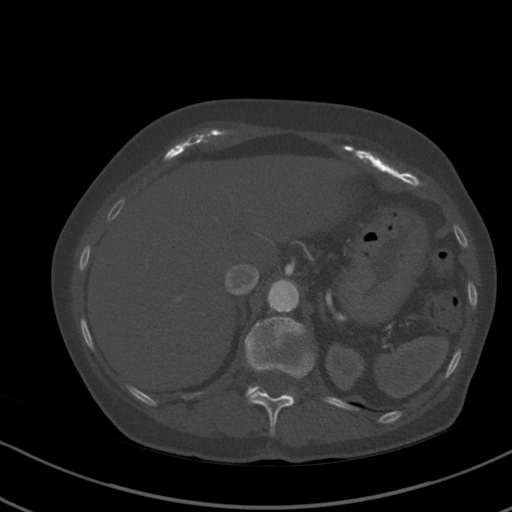
[im 127/154  soft-tissue]
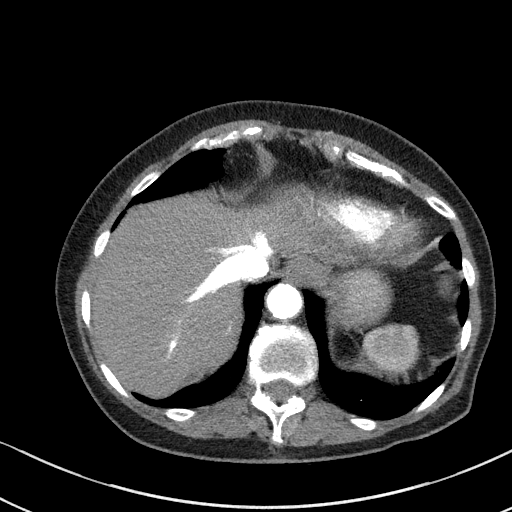
[im 145/154  soft-tissue]
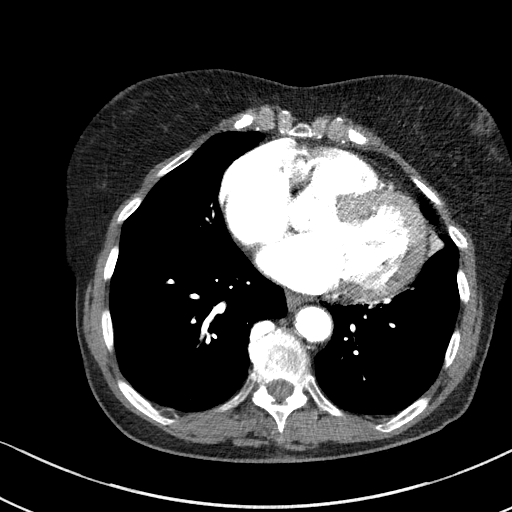

[Series 7: coronals · coronal · 0.74mm/px · 3 of 124 slices shown]
[im 31/124  soft-tissue]
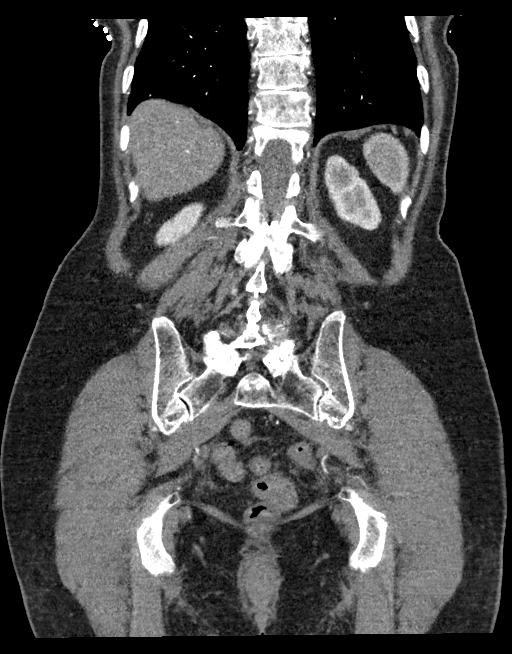
[im 62/124  soft-tissue]
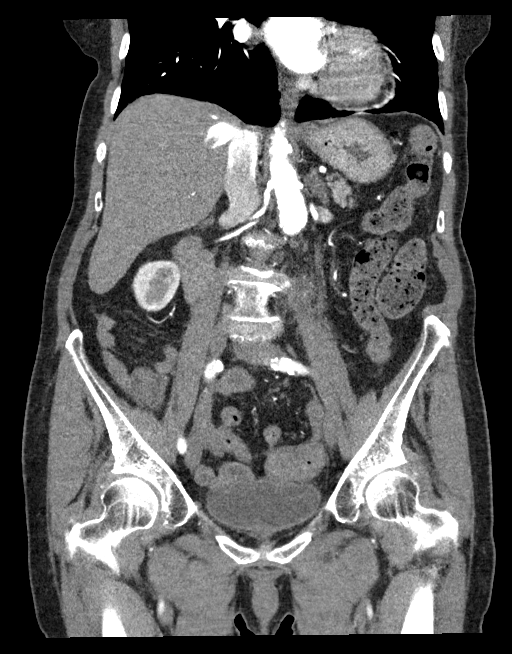
[im 93/124  soft-tissue]
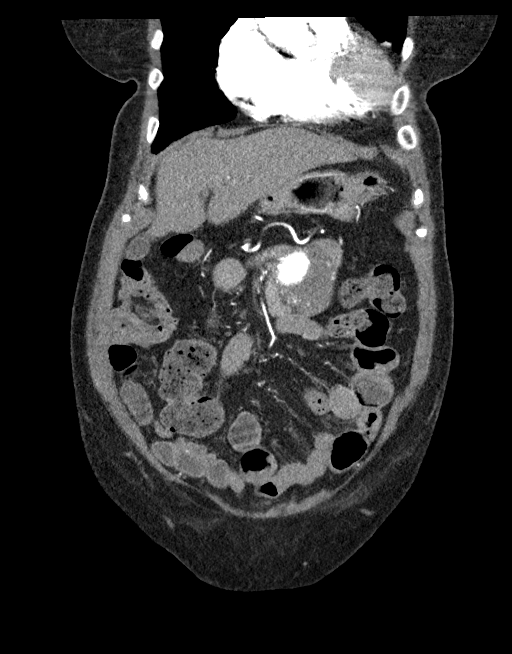

[14 of 46 positions shown; findings below may reference images not displayed]

FINDINGS: VASCULAR

Aorta: Fusiform aneurysmal dilatation of the infrarenal abdominal
aorta with a maximal diameter of 7.0 x 6.9 cm (confirmed on the
coronal and sagittal reformatted images. There is crescentic wall
adherent mural thrombus. The abdominal aorta is also tortuous.

Celiac: No evidence of aneurysm, dissection or significant stenosis.
Incidentally, the lateral segmental branch of the left hepatic
artery is replaced to the left gastric artery.

SMA: Heterogeneous atherosclerotic plaque at the origin results in
mild stenosis. No aneurysm or dissection.

Renals: Solitary left renal artery. Atherosclerotic plaque at the
origin without significant stenosis. Atherosclerotic plaque at the
origin of the dominant right renal artery results in mild stenosis.
There is an accessory renal artery supplying the lower pole and a
portion of the interpolar kidney.

IMA: Appears occluded at the origin.

Inflow: No aneurysm or dissection. Heterogeneous atherosclerotic
plaque without significant stenosis. Both internal iliac arteries
are patent.

Proximal Outflow: Bilateral common femoral and visualized portions
of the superficial and profunda femoral arteries are patent without
evidence of aneurysm, dissection, vasculitis or significant
stenosis.

Veins: No focal venous abnormality.

Review of the MIP images confirms the above findings.

NON-VASCULAR

Lower chest: Calcified and slightly thickened aortic valve. Mild
cardiomegaly with right atrial enlargement. Calcifications present
along the course of the coronary arteries. No pericardial effusion.
Unremarkable visualized distal thoracic esophagus. No suspicious
pulmonary mass or nodule.

Hepatobiliary: Normal hepatic contour and morphology. No discrete
hepatic lesions. Normal appearance of the gallbladder. No intra or
extrahepatic biliary ductal dilatation.

Pancreas: Unremarkable. No pancreatic ductal dilatation or
surrounding inflammatory changes.

Spleen: Normal in size without focal abnormality.

Adrenals/Urinary Tract: Adrenal glands are unremarkable. Kidneys are
normal, without renal calculi, focal lesion, or hydronephrosis.
Bladder is unremarkable.

Stomach/Bowel: Stomach is within normal limits. Appendix appears
normal. No evidence of bowel wall thickening, distention, or
inflammatory changes.

Lymphatic: No suspicious lymphadenopathy.

Reproductive: Uterus and bilateral adnexa are unremarkable.

Other: No abdominal wall hernia or abnormality. No abdominopelvic
ascites.

Musculoskeletal: No acute or significant osseous findings.
Multilevel degenerative disc disease.
IMPRESSION: VASCULAR

1. Fusiform infrarenal abdominal aortic aneurysm with a maximal
diameter of 7 cm.
2. Accessory right renal artery supplying the right lower pole in a
portion of the interpolar kidney (approximately 25-30% right renal
volume).
3. Lateral segmental branch of the left hepatic artery is replaced
to the right gastric artery.
4. Coronary artery calcifications.
5. Calcified and thickened aortic valve. If there is clinical
concern for underlying stenosis or regurgitation, echocardiography
could further evaluate.
6. Cardiomegaly with right atrial enlargement.

NON-VASCULAR

1. No acute or significant abnormality.
2. Multilevel degenerative disc disease.

## 2018-08-04 IMAGING — US US CAROTID DUPLEX BILAT
1 series · 13 of 24 positions shown · non-contrast
Comparison: None.

CLINICAL DATA: Bilateral carotid bruit. History of hyperlipidemia.
Former smoker.

EXAM:
BILATERAL CAROTID DUPLEX ULTRASOUND
TECHNIQUE: Gray scale imaging, color Doppler and duplex ultrasound were
performed of bilateral carotid and vertebral arteries in the neck.

[Series 1: us carotid duplex bilat · 13 of 64 slices shown]
[im 1/64]
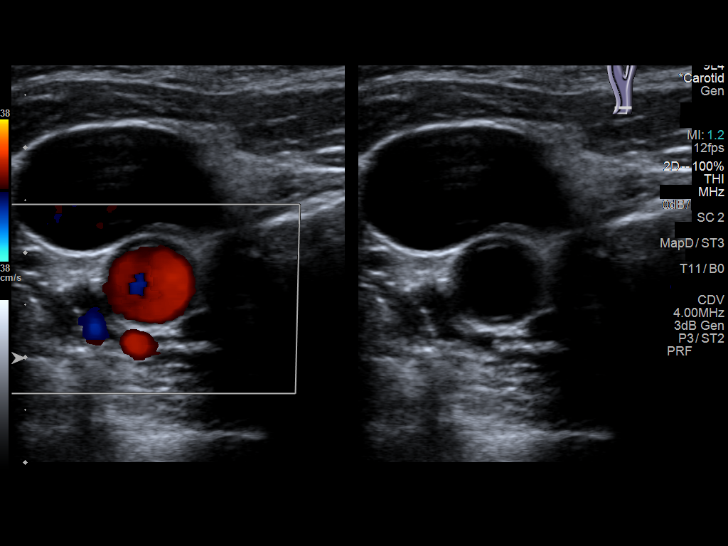
[im 6/64]
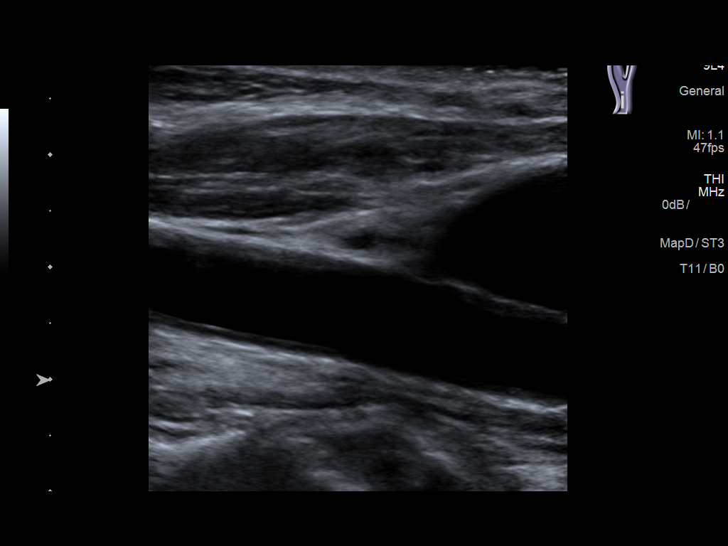
[im 11/64]
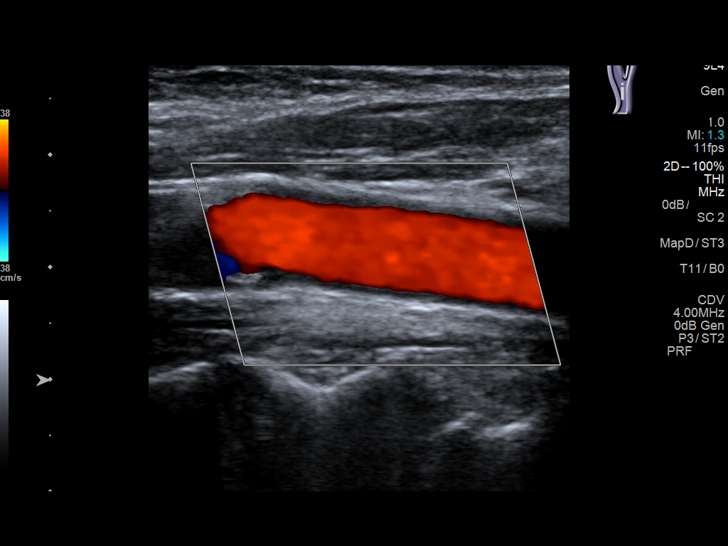
[im 17/64]
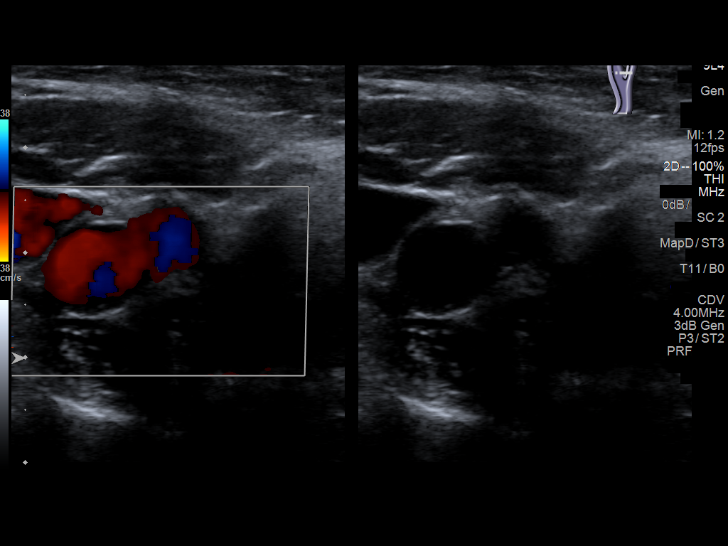
[im 22/64]
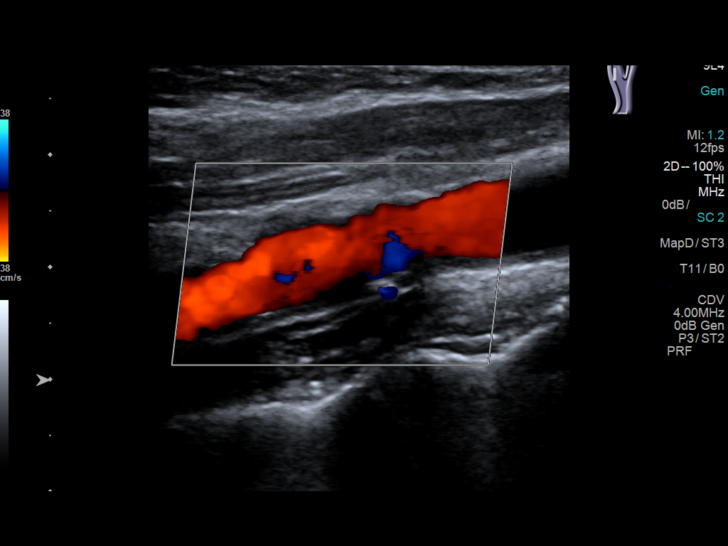
[im 28/64]
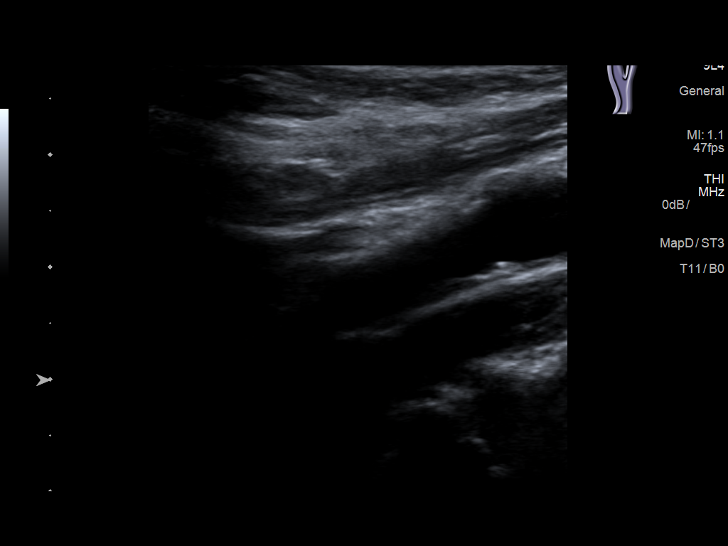
[im 33/64]
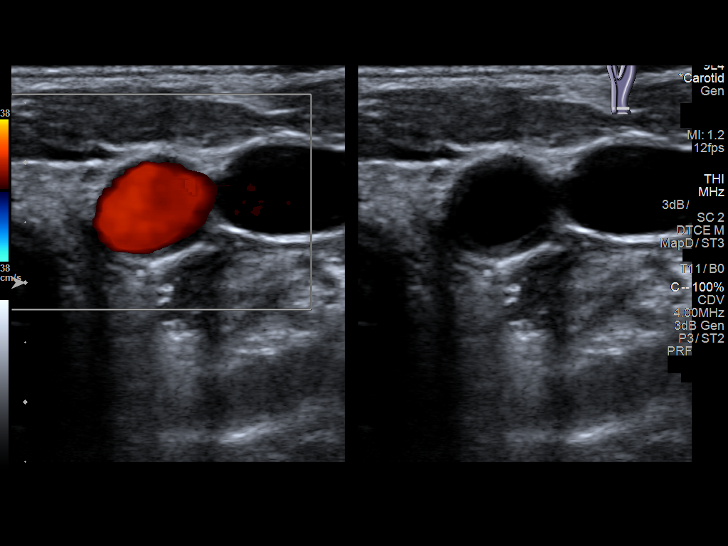
[im 36/64]
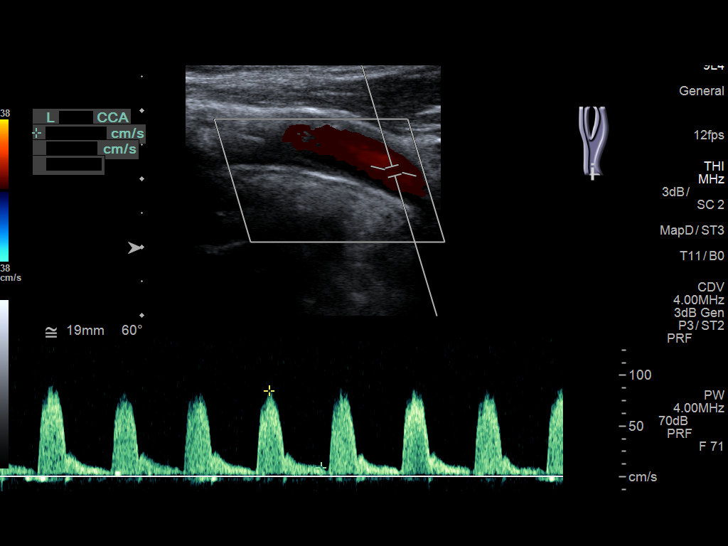
[im 42/64]
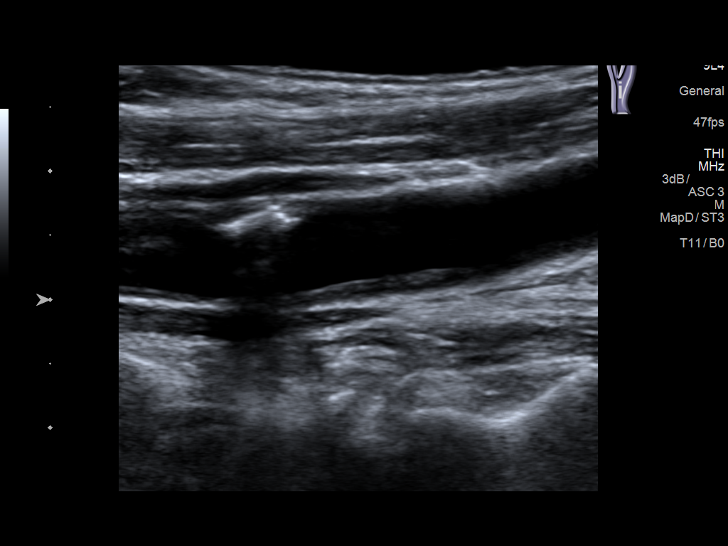
[im 47/64]
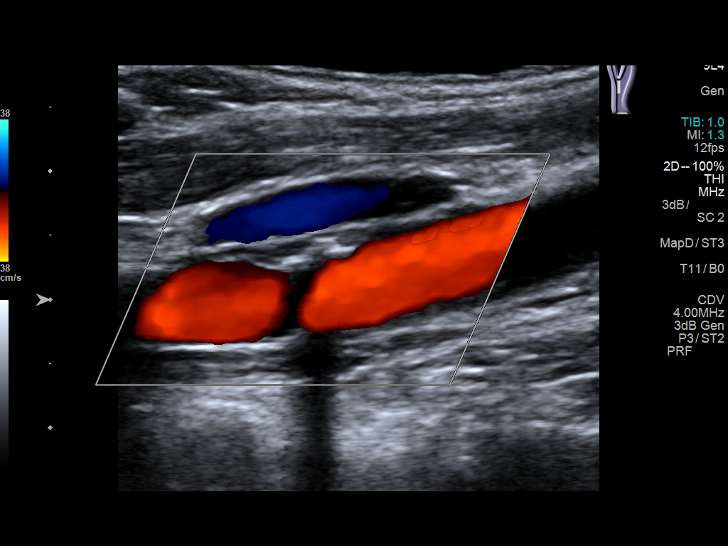
[im 53/64]
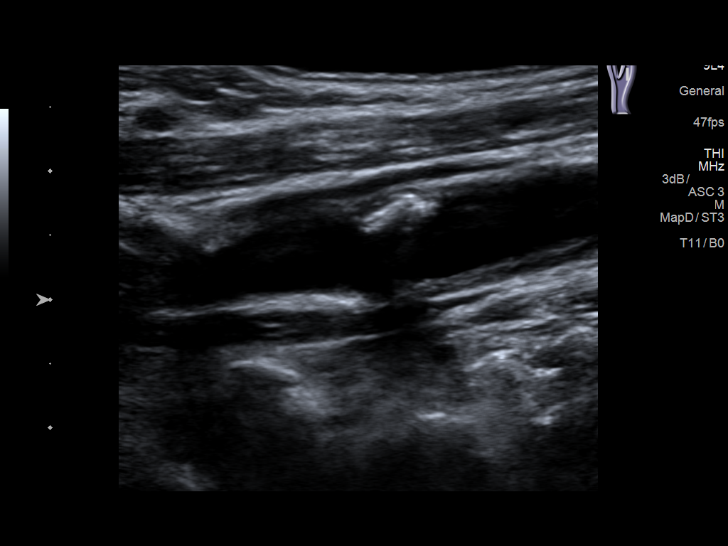
[im 58/64]
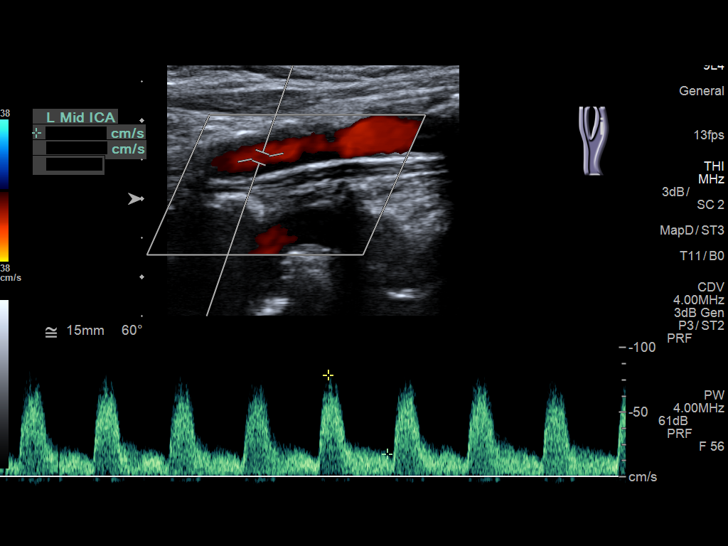
[im 64/64]
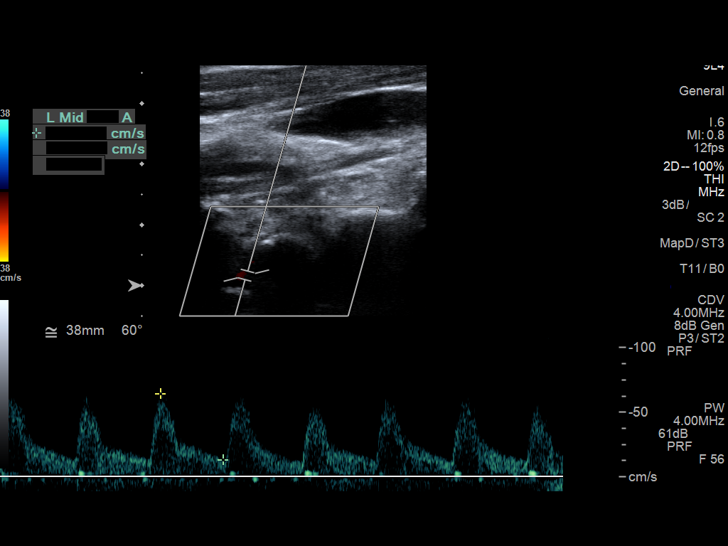

[13 of 24 positions shown; findings below may reference images not displayed]

FINDINGS: Criteria: Quantification of carotid stenosis is based on velocity
parameters that correlate the residual internal carotid diameter
with NASCET-based stenosis levels, using the diameter of the distal
internal carotid lumen as the denominator for stenosis measurement.

The following velocity measurements were obtained:

RIGHT

ICA:  84/15 cm/sec

CCA:  63/9 cm/sec

SYSTOLIC ICA/CCA RATIO:

ECA:  92 cm/sec

LEFT

ICA:  79/18 cm/sec

CCA:  74/16 cm/sec

SYSTOLIC ICA/CCA RATIO:

ECA:  85 cm/sec

RIGHT CAROTID ARTERY: There is a minimal amount of mixed echogenic
plaque within the right carotid bulb (image 16), extending to
involve the origin and proximal aspects of the right internal
carotid artery (image 23), not resulting in elevated peak systolic
velocities within the interrogated course the right internal carotid
artery to suggest a hemodynamically significant stenosis.

RIGHT VERTEBRAL ARTERY:  Antegrade flow

LEFT CAROTID ARTERY: There is a minimal amount of circumferential
intimal thickening involving the mid and distal aspects of the left
common carotid artery (representative images 40 and 44). There is a
moderate amount of echogenic plaque within the left carotid bulb
(images 47 and 48), extending to involve the origin of the left
internal carotid artery (image 55), not resulting in elevated peak
systolic velocities within the interrogated course of the left
internal carotid artery to suggest a hemodynamically significant
stenosis.

LEFT VERTEBRAL ARTERY:  Antegrade flow
IMPRESSION: Minimal to moderate amount of bilateral atherosclerotic plaque, left
greater than right, not resulting in a hemodynamically significant
stenosis within either internal carotid artery.
